# Patient Record
Sex: Female | Born: 1990 | Race: Black or African American | Hispanic: No | Marital: Single | Smoking: Never smoker
Health system: Southern US, Community
[De-identification: ages and names within clinical notes are randomized; demographics above are authoritative.]

## PROBLEM LIST (undated history)

## (undated) DIAGNOSIS — B009 Herpesviral infection, unspecified: Secondary | ICD-10-CM

## (undated) HISTORY — DX: Herpesviral infection, unspecified: B00.9

---
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
## 2011-10-22 ENCOUNTER — Emergency Department: Payer: Self-pay | Admitting: Emergency Medicine

## 2012-01-13 DIAGNOSIS — B009 Herpesviral infection, unspecified: Secondary | ICD-10-CM

## 2012-01-13 HISTORY — DX: Herpesviral infection, unspecified: B00.9

## 2012-03-01 ENCOUNTER — Emergency Department: Payer: Self-pay | Admitting: Emergency Medicine

## 2012-03-01 LAB — URINALYSIS, COMPLETE
Bacteria: NONE SEEN
Bilirubin,UR: NEGATIVE
Blood: NEGATIVE
Glucose,UR: NEGATIVE mg/dL (ref 0–75)
Ketone: NEGATIVE
Nitrite: NEGATIVE
Ph: 6 (ref 4.5–8.0)
Protein: NEGATIVE
RBC,UR: 1 /HPF (ref 0–5)
Specific Gravity: 1.024 (ref 1.003–1.030)
Squamous Epithelial: 1
WBC UR: 1 /HPF (ref 0–5)

## 2012-08-29 ENCOUNTER — Emergency Department: Payer: Self-pay | Admitting: Emergency Medicine

## 2012-10-01 ENCOUNTER — Emergency Department: Payer: Self-pay | Admitting: Emergency Medicine

## 2012-10-01 LAB — COMPREHENSIVE METABOLIC PANEL
Albumin: 3.5 g/dL (ref 3.4–5.0)
Alkaline Phosphatase: 117 U/L (ref 50–136)
Anion Gap: 7 (ref 7–16)
BUN: 18 mg/dL (ref 7–18)
Bilirubin,Total: 0.2 mg/dL (ref 0.2–1.0)
Calcium, Total: 9.7 mg/dL (ref 8.5–10.1)
Chloride: 108 mmol/L — ABNORMAL HIGH (ref 98–107)
Co2: 22 mmol/L (ref 21–32)
Creatinine: 0.68 mg/dL (ref 0.60–1.30)
EGFR (African American): >60
EGFR (Non-African Amer.): >60
Glucose: 99 mg/dL (ref 65–99)
Osmolality: 276 (ref 275–301)
Potassium: 4.3 mmol/L (ref 3.5–5.1)
SGOT(AST): 31 U/L (ref 15–37)
SGPT (ALT): 60 U/L (ref 12–78)
Sodium: 137 mmol/L (ref 136–145)
Total Protein: 7.8 g/dL (ref 6.4–8.2)

## 2012-10-01 LAB — DRUG SCREEN, URINE

## 2012-10-01 LAB — URINALYSIS, COMPLETE
Bacteria: NONE SEEN
Bilirubin,UR: NEGATIVE
Glucose,UR: NEGATIVE mg/dL (ref 0–75)
Ketone: NEGATIVE
Leukocyte Esterase: NEGATIVE
Nitrite: NEGATIVE
Ph: 7 (ref 4.5–8.0)
Protein: NEGATIVE
RBC,UR: 1 /HPF (ref 0–5)
Specific Gravity: 1.017 (ref 1.003–1.030)
Squamous Epithelial: 3
WBC UR: 1 /HPF (ref 0–5)

## 2012-10-01 LAB — PROTIME-INR
INR: 0.9
Prothrombin Time: 12.8 secs (ref 11.5–14.7)

## 2012-10-01 LAB — CBC
HCT: 41.2 % (ref 35.0–47.0)
HGB: 13.7 g/dL (ref 12.0–16.0)
MCH: 27.7 pg (ref 26.0–34.0)
MCHC: 33.1 g/dL (ref 32.0–36.0)
MCV: 83 fL (ref 80–100)
Platelet: 498 10*3/uL — ABNORMAL HIGH (ref 150–440)
RBC: 4.94 10*6/uL (ref 3.80–5.20)
RDW: 15.3 % — ABNORMAL HIGH (ref 11.5–14.5)
WBC: 12.2 10*3/uL — ABNORMAL HIGH (ref 3.6–11.0)

## 2012-10-01 LAB — ETHANOL
Ethanol %: <0.003 % (ref 0.000–0.080)
Ethanol: <3 mg/dL

## 2012-10-01 LAB — LIPASE, BLOOD: Lipase: 107 U/L (ref 73–393)

## 2013-03-25 ENCOUNTER — Emergency Department: Payer: Self-pay | Admitting: Emergency Medicine

## 2013-03-27 LAB — BETA STREP CULTURE(ARMC)

## 2013-05-27 ENCOUNTER — Emergency Department: Payer: Self-pay | Admitting: Emergency Medicine

## 2013-08-30 ENCOUNTER — Emergency Department: Payer: Self-pay | Admitting: Emergency Medicine

## 2013-09-01 ENCOUNTER — Inpatient Hospital Stay: Payer: Self-pay | Admitting: Internal Medicine

## 2013-09-01 LAB — CBC WITH DIFFERENTIAL/PLATELET
Basophil #: 0.1 10*3/uL (ref 0.0–0.1)
Basophil %: 0.5 %
Eosinophil #: 0.1 10*3/uL (ref 0.0–0.7)
Eosinophil %: 0.8 %
HCT: 40.9 % (ref 35.0–47.0)
HGB: 13.4 g/dL (ref 12.0–16.0)
Lymphocyte #: 1.6 10*3/uL (ref 1.0–3.6)
Lymphocyte %: 11.6 %
MCH: 28.2 pg (ref 26.0–34.0)
MCHC: 32.8 g/dL (ref 32.0–36.0)
MCV: 86 fL (ref 80–100)
Monocyte #: 1.3 x10 3/mm — ABNORMAL HIGH (ref 0.2–0.9)
Monocyte %: 9.1 %
Neutrophil #: 11 10*3/uL — ABNORMAL HIGH (ref 1.4–6.5)
Neutrophil %: 78 %
Platelet: 448 10*3/uL — ABNORMAL HIGH (ref 150–440)
RBC: 4.76 10*6/uL (ref 3.80–5.20)
RDW: 15.2 % — ABNORMAL HIGH (ref 11.5–14.5)
WBC: 14.1 10*3/uL — ABNORMAL HIGH (ref 3.6–11.0)

## 2013-09-01 LAB — COMPREHENSIVE METABOLIC PANEL
Albumin: 3.5 g/dL (ref 3.4–5.0)
Alkaline Phosphatase: 86 U/L
Anion Gap: 10 (ref 7–16)
BUN: 15 mg/dL (ref 7–18)
Bilirubin,Total: 0.3 mg/dL (ref 0.2–1.0)
Calcium, Total: 9.7 mg/dL (ref 8.5–10.1)
Chloride: 104 mmol/L (ref 98–107)
Co2: 26 mmol/L (ref 21–32)
Creatinine: 1.04 mg/dL (ref 0.60–1.30)
EGFR (African American): >60
EGFR (Non-African Amer.): >60
Glucose: 89 mg/dL (ref 65–99)
Osmolality: 280 (ref 275–301)
Potassium: 3.6 mmol/L (ref 3.5–5.1)
SGOT(AST): 18 U/L (ref 15–37)
SGPT (ALT): 29 U/L
Sodium: 140 mmol/L (ref 136–145)
Total Protein: 8.5 g/dL — ABNORMAL HIGH (ref 6.4–8.2)

## 2013-09-01 LAB — URINALYSIS, COMPLETE
Bilirubin,UR: NEGATIVE
Blood: NEGATIVE
Glucose,UR: NEGATIVE mg/dL (ref 0–75)
Leukocyte Esterase: NEGATIVE
Nitrite: NEGATIVE
Ph: 5 (ref 4.5–8.0)
Protein: NEGATIVE
RBC,UR: 13 /HPF (ref 0–5)
Specific Gravity: 1.023 (ref 1.003–1.030)
Squamous Epithelial: 5
WBC UR: 3 /HPF (ref 0–5)

## 2013-09-02 LAB — BASIC METABOLIC PANEL
Anion Gap: 9 (ref 7–16)
BUN: 14 mg/dL (ref 7–18)
Calcium, Total: 8.6 mg/dL (ref 8.5–10.1)
Chloride: 106 mmol/L (ref 98–107)
Co2: 24 mmol/L (ref 21–32)
Creatinine: 0.89 mg/dL (ref 0.60–1.30)
EGFR (African American): >60
EGFR (Non-African Amer.): >60
Glucose: 93 mg/dL (ref 65–99)
Osmolality: 278 (ref 275–301)
Potassium: 3.6 mmol/L (ref 3.5–5.1)
Sodium: 139 mmol/L (ref 136–145)

## 2013-09-02 LAB — CBC WITH DIFFERENTIAL/PLATELET
Basophil #: 0.1 10*3/uL (ref 0.0–0.1)
Basophil %: 0.4 %
Eosinophil #: 0.1 10*3/uL (ref 0.0–0.7)
Eosinophil %: 1 %
HCT: 36.1 % (ref 35.0–47.0)
HGB: 11.5 g/dL — ABNORMAL LOW (ref 12.0–16.0)
Lymphocyte #: 2.4 10*3/uL (ref 1.0–3.6)
Lymphocyte %: 18.9 %
MCH: 27.8 pg (ref 26.0–34.0)
MCHC: 32 g/dL (ref 32.0–36.0)
MCV: 87 fL (ref 80–100)
Monocyte #: 1.4 x10 3/mm — ABNORMAL HIGH (ref 0.2–0.9)
Monocyte %: 10.6 %
Neutrophil #: 8.9 10*3/uL — ABNORMAL HIGH (ref 1.4–6.5)
Neutrophil %: 69.1 %
Platelet: 408 10*3/uL (ref 150–440)
RBC: 4.16 10*6/uL (ref 3.80–5.20)
RDW: 14.6 % — ABNORMAL HIGH (ref 11.5–14.5)
WBC: 12.8 10*3/uL — ABNORMAL HIGH (ref 3.6–11.0)

## 2013-09-03 LAB — VANCOMYCIN, TROUGH: Vancomycin, Trough: 1 ug/mL — ABNORMAL LOW (ref 10–20)

## 2013-09-04 LAB — VANCOMYCIN, TROUGH: Vancomycin, Trough: 14 ug/mL (ref 10–20)

## 2013-09-06 LAB — CULTURE, BLOOD (SINGLE)

## 2013-09-07 LAB — WOUND CULTURE

## 2013-09-24 ENCOUNTER — Emergency Department: Payer: Self-pay | Admitting: Emergency Medicine

## 2013-11-08 ENCOUNTER — Ambulatory Visit: Payer: Self-pay | Admitting: Surgery

## 2013-12-13 ENCOUNTER — Inpatient Hospital Stay: Payer: Self-pay | Admitting: Surgery

## 2013-12-13 LAB — CBC WITH DIFFERENTIAL/PLATELET
Basophil #: 0.1 10*3/uL (ref 0.0–0.1)
Basophil %: 0.7 %
Eosinophil #: 0 10*3/uL (ref 0.0–0.7)
Eosinophil %: 0.5 %
HCT: 41.7 % (ref 35.0–47.0)
HGB: 13.5 g/dL (ref 12.0–16.0)
Lymphocyte #: 1.4 10*3/uL (ref 1.0–3.6)
Lymphocyte %: 16.8 %
MCH: 28.1 pg (ref 26.0–34.0)
MCHC: 32.3 g/dL (ref 32.0–36.0)
MCV: 87 fL (ref 80–100)
Monocyte #: 0.4 x10 3/mm (ref 0.2–0.9)
Monocyte %: 4.9 %
Neutrophil #: 6.6 10*3/uL — ABNORMAL HIGH (ref 1.4–6.5)
Neutrophil %: 77.1 %
Platelet: 410 10*3/uL (ref 150–440)
RBC: 4.8 10*6/uL (ref 3.80–5.20)
RDW: 15.2 % — ABNORMAL HIGH (ref 11.5–14.5)
WBC: 8.6 10*3/uL (ref 3.6–11.0)

## 2013-12-13 LAB — URINALYSIS, COMPLETE
Bacteria: NONE SEEN
Bilirubin,UR: NEGATIVE
Blood: NEGATIVE
Glucose,UR: NEGATIVE mg/dL (ref 0–75)
Ketone: NEGATIVE
Leukocyte Esterase: NEGATIVE
Nitrite: NEGATIVE
Ph: 7 (ref 4.5–8.0)
Protein: NEGATIVE
RBC,UR: NONE SEEN /HPF (ref 0–5)
Specific Gravity: 1.014 (ref 1.003–1.030)
Squamous Epithelial: 5
WBC UR: NONE SEEN /HPF (ref 0–5)

## 2013-12-13 LAB — COMPREHENSIVE METABOLIC PANEL
Albumin: 3.7 g/dL (ref 3.4–5.0)
Alkaline Phosphatase: 79 U/L
Anion Gap: 9 (ref 7–16)
BUN: 16 mg/dL (ref 7–18)
Bilirubin,Total: 0.3 mg/dL (ref 0.2–1.0)
Calcium, Total: 9.2 mg/dL (ref 8.5–10.1)
Chloride: 107 mmol/L (ref 98–107)
Co2: 24 mmol/L (ref 21–32)
Creatinine: 0.77 mg/dL (ref 0.60–1.30)
EGFR (African American): >60
EGFR (Non-African Amer.): >60
Glucose: 118 mg/dL — ABNORMAL HIGH (ref 65–99)
Osmolality: 282 (ref 275–301)
Potassium: 3.7 mmol/L (ref 3.5–5.1)
SGOT(AST): 29 U/L (ref 15–37)
SGPT (ALT): 40 U/L
Sodium: 140 mmol/L (ref 136–145)
Total Protein: 8.1 g/dL (ref 6.4–8.2)

## 2013-12-13 LAB — LIPASE, BLOOD: Lipase: 119 U/L (ref 73–393)

## 2013-12-13 LAB — PREGNANCY, URINE: Pregnancy Test, Urine: NEGATIVE m[IU]/mL

## 2014-05-05 NOTE — H&P (Signed)
PATIENT NAME:  Catherine [name], Catherine [name] MR#:  [id number] DATE OF BIRTH:  Apr 16, 1990  DATE OF ADMISSION:  09/01/2013  REFERRING PHYSICIAN: Dr. Cyril LoosenKinner.  PRIMARY CARE PHYSICIAN: None.   CHIEF COMPLAINT: Abscess of the buttocks.   HISTORY OF PRESENT ILLNESS: This 24 year old female without significant past medical history only history of pilonidal cyst with recurrent abscess and incision and drainage in the past, most recent before 2 months. The patient presents with fever and worsening of abscess and over the last 3 days, and tenderness in that area. Upon presentation, the patient was found to be febrile at 99.3 and tachycardic at 119. As well, she did have significant leukocytosis of 14,000. The patient had incision and drainage done by ED physician, the patient reports she was supposed to follow with Dr. Egbert GaribaldiBird  next week for pilonidal cyst. Given the patient's fever and leukocytosis hospitalist was requested for the patient for IV antibiotics after her blood cultures were sent. The patient denies any chest pain, shortness of breath, hemoptysis, diarrhea, constipation, dysuria or polyuria.   PAST MEDICAL HISTORY: None.   PAST SURGICAL HISTORY: C-section.   ALLERGIES: No known drug allergies.   HOME MEDICATIONS: None.   FAMILY HISTORY: There is family history of  diabetes mellitus and hypertension.   SOCIAL HISTORY: The patient smokes 5 cigarettes per cigarettes per day. Works as LawyerCNA. No alcohol. No illicit drug use.   REVIEW OF SYSTEMS:  CONSTITUTIONAL: Reports fever, chills. Denies any fatigue, weight gain, weight loss.  EYES: Denies blurry vision, double vision, inflammation.  EARS, NOSE, THROAT: Denies tinnitus, ear pain, hearing loss, epistaxis or discharge.  RESPIRATORY: Denies cough, wheezing, hemoptysis, COPD.  CARDIOVASCULAR: Denies chest pain, orthopnea, edema.  GASTROINTESTINAL: Denies nausea, vomiting, diarrhea, abdominal pain.  GENITOURINARY: Denies dysuria, hematuria, or renal  colic.  ENDOCRINE: Denies polyuria, polydipsia, heat or cold intolerance.  HEMATOLOGY: Denies anemia, easy bruising, bleeding diathesis.  INTEGUMENTARY: Denies any acne, rash, reports abscess in her buttocks area.  MUSCULOSKELETAL: Denies any swelling, gout, cramps.  NEUROLOGIC: Denies CVA, TIA, tremors, vertigo, ataxia.  PSYCHIATRIC: Denies anxiety, insomnia or depression.   PHYSICAL EXAMINATION:  VITAL SIGNS: Temperature 99.3, pulse 101, respiratory rate 16, blood pressure 130/68, saturating 95% on room air.  GENERAL: Well-nourished female who looks comfortable, in no apparent distress.  HEENT: Head atraumatic, normocephalic. Pupils equal, reactive to light. Pink conjunctivae. Anicteric sclerae. Moist oral mucosa.  NECK: Supple. No thyromegaly. No JVD. No carotid bruits.  CHEST: Good air entry bilaterally. No wheezing, rales, rhonchi.  CARDIOVASCULAR: S1, S2 heard. No rubs, murmur or gallops.  ABDOMEN: Soft, nontender, nondistended. Bowel sounds present.  EXTREMITIES: No edema. No clubbing. No cyanosis. Pedal pulses +2 bilaterally.  PSYCHIATRIC: Appropriate affect. Awake, alert x 3. Intact judgment and insight.  NEUROLOGIC: Cranial nerves grossly intact. Motor 5/5. No focal deficits.  MUSCULOSKELETAL: No joint effusion or erythema.  SKIN: Has a skin abscess in the buttocks area which is drained and currently the wound appears to be packed. No discharge or oozing from the site.  LYMPHATIC: No cervical or subclavicular lymphadenopathy.   PERTINENT LABORATORY DATA: Glucose 89, BUN 15, creatinine 1.04, sodium 140, potassium 3.6, chloride 104, white blood cells 14,000, hemoglobin 13.4, hematocrit 40.9, platelets 448,000. Urinalysis negative for leukocyte esterase and nitrite.   ASSESSMENT AND PLAN:  1. Sepsis. The patient presents with tachycardia, fever and leukocytosis. This is secondary to abscess in her pilonidal cyst status post incision and drainage by ED physician and wound packing. The  patient will be admitted  for broad-spectrum intravenous antibiotics. We will follow on her urine cultures and adjust as needed. We will consult surgical service, Dr. Egbert Garibaldi  as she was supposed to see him next week as an outpatient.  2. As well, we will continue her on p.r.n. pain medicine.  3. Deep vein thrombosis prophylaxis. Subcutaneous heparin.   CODE STATUS: Full code.   TOTAL TIME SPENT ON ADMISSION AND PATIENT CARE: 45 minutes.     ____________________________ Starleen Arms, MD dse:JT D: 09/02/2013 00:03:00 ET T: 09/02/2013 01:00:06 ET JOB#: [id number]  cc: Starleen Arms, MD, <Dictator> DAWOOD Teena Irani MD ELECTRONICALLY SIGNED 09/02/2013 4:14

## 2014-05-05 NOTE — Op Note (Signed)
PATIENT NAME:  Catherine [name], Catherine [name] MR#:  [id number] DATE OF BIRTH:  [birth year]  DATE OF PROCEDURE:  11/08/2013  PREOPERATIVE DIAGNOSIS: Recurrent pilonidal cyst infection and abscess.   POSTOPERATIVE DIAGNOSIS: Recurrent pilonidal cyst infection and abscess.   PROCEDURE PERFORMED: Pilonidal cystectomy.   SURGEON: Dr. Natale LayMark Abiola Behring. D, FACS  ANESTHESIA: General with 30 mL of 0.25% plain Marcaine as local.   FINDINGS: Pilonidal cyst.   SPECIMENS: Pilonidal cyst.   ESTIMATED BLOOD LOSS: 25 mL.   DESCRIPTION OF PROCEDURE: With informed consent, supine position on the stretcher, general endotracheal anesthesia was induced. The patient was then padded and positioned in prone jackknife positioning. The lower back and sacral region was sterilely prepped and draped with Betadine solution. Timeout was observed. An elliptical incision encompassing the previous tract of infection on the right side was fashioned with scalpel and carried down with electrocautery through subcutaneous fat down to the presacral fascia. Hemostasis being obtained, a specimen was submitted to pathology in formalin.   Total of 30 mL of 0.25% plain Marcaine was infiltrated on the operative field. The wound was then reapproximated easily utilizing a combination of deep 0 Vicryl sutures, deep dermal 3-0 Vicryl sutures, and a combination of 3-0 nylon simple and vertical mattress in the skin layer. A sterile dressing was applied. The patient tolerated the procedure well without immediate complication and was transported to the recovery room following return to the supine position and extubation by anesthesia services.    ____________________________ Redge GainerMark A. Egbert GaribaldiBird, MD FACS mab:lt D: 11/08/2013 10:34:24 ET T: 11/08/2013 17:32:40 ET JOB#: [id number]  cc: Loraine LericheMark A. Egbert GaribaldiBird, MD, <Dictator> Raynald KempMARK A Rashidi Loh MD ELECTRONICALLY SIGNED 11/12/2013 17:33

## 2014-05-05 NOTE — Op Note (Signed)
PATIENT NAME:  Catherine [name], Catherine [name] DATE OF BIRTH:  10-30-1990  DATE OF PROCEDURE:  12/14/2013  PREOPERATIVE DIAGNOSIS:  Acute calculus cholecystitis.     POSTOPERATIVE DIAGNOSIS:  Acute calculus cholecystitis with hydrops.   PROCEDURE PERFORMED: Laparoscopic cholecystectomy.   SURGEON: Nhyira Leano A. Egbert GaribaldiBird, MD.   ASSISTANT: None.   TYPE OF ANESTHESIA: General endotracheal.   FINDINGS: As above.   SPECIMENS: As above.   ESTIMATED BLOOD LOSS: Minimal.   DESCRIPTION OF PROCEDURE: With informed consent, supine position, and general oral endotracheal anesthesia, the patient's abdomen was widely prepped and draped with ChloraPrep solution. Timeout was observed. A 12 mm blunt Hasson trocar was placed under direct visualization and pneumoperitoneum was established. The patient was then positioned in reverse Trendelenburg and airplaned right side up. Remaining trocars were placed with a 5 mm bladeless trocar in the epigastric region, two 5 mm ports in the right subcostal margin. The gallbladder was aspirated of 20 mL of hydropic fluid allowing grasping of the fundus. The gallbladder was grasped along the fundus and elevated towards the right shoulder laterally. Lateral traction was achieved on Hartmann pouch. The hepatoduodenal ligament was then incised utilizing blunt technique and a critical view of safety cholecystectomy was achieved. The cystic duct was triply clipped on the portal side, singly clipped on the gallbladder side, and divided. Likewise the cystic artery was doubly clipped on the portal side, singly clipped on the gallbladder side, and divided. The gallbladder was then retrieved off the gallbladder fossa utilizing hook cautery apparatus and placed into an Endo Catch device and retrieved. With pneumoperitoneum re-established the right upper quadrant was irrigated with a total of 1 liter of normal saline and aspirated dry. Point hemostasis was obtained with cautery. Ports were then  removed. The infraumbilical fascial defect was closed with an additional figure-of-eight number 0 Vicryl suture and the existing stay sutures tied to each other.   30 mL of 0.25% plain Marcaine was infiltrated along all skin and fascial incisions prior to closure. 4-0 Vicryl subcuticular was applied to all skin edges followed by the application of benzoin, Steri-Strips, Telfa, and Tegaderm. The patient was then subsequently extubated and taken to the recovery room in stable and satisfactory condition by anesthesia services.     ____________________________ Redge GainerMark A. Egbert GaribaldiBird, MD mab:bu D: 12/19/2013 15:29:32 ET T: 12/19/2013 21:06:58 ET JOB#: [id number]  cc: Loraine LericheMark A. Egbert GaribaldiBird, MD, <Dictator> Mahi Zabriskie A Terran Hollenkamp MD ELECTRONICALLY SIGNED 12/24/2013 22:03

## 2014-05-05 NOTE — H&P (Signed)
PATIENT NAME:  Catherine [name], Catherine [name] MR#:  [id number] DATE OF BIRTH:  October 15, 1990  DATE OF ADMISSION:  12/13/2013  CHIEF COMPLAINT: Right upper quadrant pain.   HISTORY OF PRESENT ILLNESS: This is a patient with multiple episodes of recurrent episodic right upper quadrant pain that had been associated with fatty food intolerance that has been going on about a year, but this time it started yesterday evening after dinner and it has been unrelenting. It is causing her considerable right upper quadrant pain. She is also nauseated and has vomited multiple times. She had a normal bowel movement yesterday. No melena or hematochezia. No fevers or chills. No jaundice or acholic stools. She has no back pain.   PAST MEDICAL HISTORY: None.   PAST SURGICAL HISTORY: C-section, pilonidal cyst I and D, and pilonidal cystectomy recently by Dr. Natale LayMark Bird.   ALLERGIES: None.   MEDICATIONS: None.   FAMILY HISTORY: Noncontributory.   SOCIAL HISTORY: The patient is a CNA. She smokes about 1/2 pack of cigarettes per day. She does not drink much alcohol on a routine basis.   REVIEW OF SYSTEMS: A 10 system review is performed and negative, with the exception of that mentioned in the HPI.   PHYSICAL EXAMINATION:  GENERAL: Obese female patient with a BMI of 35; 200 pounds. In general, she appears uncomfortable, laying on her left side, but in no acute distress.  VITAL SIGNS: Temperature of 97.5, pulse 77, respirations 18, blood pressure 110/65, pain scale of 8, 100% room air saturation.  HEENT: No scleral icterus.  NECK: No palpable neck nodes.  CHEST: Clear to auscultation.  CARDIAC: Regular rate and rhythm.  ABDOMEN: Soft, but there is some localized guarding and positive Murphy sign in the right upper quadrant; C-section scar is noted.  EXTREMITIES: Without edema.  NEUROLOGIC: Grossly intact.  INTEGUMENT: A healing wound in the pilonidal area, without erythema or drainage.   DIAGNOSTIC DATA: Chem panel is  currently pending. White blood cell count is 8.6, H and H of 13.5 and 42, with a platelet count of 410,000. Again, electrolytes and LFTs are not as yet available.   An ultrasound shows a large stone impacted in the neck of the gallbladder with a positive ultrasonographic Murphy sign.   ASSESSMENT AND PLAN: Acute cholecystitis. Recommend admission and hydration, due to her nausea and vomiting. Control her nausea and vomiting with antiemetics. Start IV antibiotics and plan laparoscopic cholecystectomy at Dr. Molinda BailiffBird's convenience and discretion. I will discuss the patient with him. He has recently operated on her; therefore, I did not do the consent to allow him the opportunity to do that at his discretion.    ____________________________ Adah Salvageichard E. Excell Seltzerooper, MD rec:MT D: 12/13/2013 06:53:47 ET T: 12/13/2013 08:44:47 ET JOB#: [id number]  cc: Adah Salvageichard E. Excell Seltzerooper, MD, <Dictator> Lattie HawICHARD E Klara Stjames MD ELECTRONICALLY SIGNED 12/13/2013 23:30

## 2014-05-05 NOTE — H&P (Signed)
Subjective/Chief Complaint RUQ pain   History of Present Illness mult episodes with FFI, now unrelenting started yest eve nausea, emesis no f/c no jaundice   Past History PMH none PSH Csection, pilo cyst I&D and recent cystectomy   Past Med/Surgical Hx:  Obesity:   Pilonidal Cyst:   I & D Pilonidal Cyst:   C-Section:   ALLERGIES:  No Known Allergies:   Family and Social History:  Family History Non-Contributory   Social History positive  tobacco, negative ETOH, CNA   + Tobacco Current (within 1 year)   Place of Living Home   Review of Systems:  Fever/Chills No   Cough No   Abdominal Pain Yes   Diarrhea No   Constipation No   Nausea/Vomiting Yes   SOB/DOE No   Chest Pain No   Dysuria No   Tolerating Diet No  Nauseated  Vomiting   Physical Exam:  GEN no acute distress, obese, uncomfortable   HEENT pink conjunctivae   NECK supple   RESP normal resp effort  clear BS   CARD regular rate   ABD positive tenderness  pos Murphy's sign   LYMPH negative neck   EXTR negative edema   SKIN normal to palpation, pilo cyst scar healing   PSYCH alert, A+O to time, place, person, good insight   Lab Results: Hepatic:  02-Dec-15 04:42   Bilirubin, Total -  Alkaline Phosphatase - (46-116 NOTE: New Reference Range 08/01/13)  SGPT (ALT) - (14-63 NOTE: New Reference Range 08/01/13)  SGOT (AST) -  Total Protein, Serum -  Albumin, Serum -  Routine Chem:  02-Dec-15 04:42   Result Comment CBC/METC/LIPASE - QUESTIONABLE SPECIMEN INTEGRITY. ORDER  - CANCELLED AND REORDERED FOR A RECOLLECT.  - CALLED RAQUEL DAVID AT 0141 ON 12/13/13  - [place]  Result(s) reported on 13 Dec 2013 at 05:18AM.  Glucose, Serum -  BUN -  Creatinine (comp) -  Sodium, Serum -  Potassium, Serum -  Chloride, Serum -  CO2, Serum -  Calcium (Total), Serum -  Osmolality (calc) -  eGFR (African American) -  eGFR (Non-African American) - (eGFR values <25m/min/1.73 m2 may be an  indication of chronic kidney disease (CKD). Calculated eGFR, using the MRDR Study equation, is useful in  patients with stable renal function. The eGFR calculation will not be reliable in acutely ill patients when serum creatinine is changing rapidly. It is not useful in patients on dialysis. The eGFR calculation may not be applicable to patients at the low and high extremes of body sizes, pregnant women, and vegetarians.)  Anion Gap -  Lipase - (Result(s) reported on 13 Dec 2013 at 05:18AM.)  Routine Hem:  02-Dec-15 04:42   WBC (CBC) -  RBC (CBC) -  Hemoglobin (CBC) -  Hematocrit (CBC) -  Platelet Count (CBC) -  MCV -  MCH -  MCHC -  RDW -  Neutrophil % -  Lymphocyte % -  Monocyte % -  Eosinophil % -  Basophil % -  Neutrophil # -  Lymphocyte # -  Monocyte # -  Eosinophil # -  Basophil # -  Bands -  Segmented Neutrophils -  Lymphocytes -  Variant Lymphocytes -  Monocytes -  Eosinophil -  Basophil -  Metamyelocyte -  Myelocyte -  Promyelocyte -  Blast-Like -  Other Cells -  NRBC -  Diff Comment 1 -  Diff Comment 2 -  Diff Comment 3 -  Diff Comment 4 -  Diff  Comment 5 -  Diff Comment 6 -  Diff Comment 7 -  Diff Comment 8 -  Diff Comment 9 -  Diff Comment 10 - (Result(s) reported on 13 Dec 2013 at 05:18AM.)    06:18   WBC (CBC) 8.6  RBC (CBC) 4.80  Hemoglobin (CBC) 13.5  Hematocrit (CBC) 41.7  Platelet Count (CBC) 410  MCV 87  MCH 28.1  MCHC 32.3  RDW  15.2  Neutrophil % 77.1  Lymphocyte % 16.8  Monocyte % 4.9  Eosinophil % 0.5  Basophil % 0.7  Neutrophil #  6.6  Lymphocyte # 1.4  Monocyte # 0.4  Eosinophil # 0.0  Basophil # 0.1 (Result(s) reported on 13 Dec 2013 at 06:38AM.)   Radiology Results: [place]:    02-Dec-15 05:40, US Abdomen Limited Survey  US Abdomen Limited Survey  REASON FOR EXAM:    RUQ PAIN, VOMITING  COMMENTS:   Body Site: Gallbladder, Liver, Common Bile Duct    PROCEDURE: [place]  - US ABDOMEN LIMITED SURVEY  - Dec 13 2013   5:40AM     CLINICAL DATA:  Acute onset of right upper quadrant abdominal pain.  Initial encounter.    EXAM:  US ABDOMEN LIMITED - RIGHT UPPER QUADRANT    COMPARISON:  None.    FINDINGS:  Gallbladder:  A 1.7 cm stone is seen lodged at the neck of the gallbladder. The  gallbladder is mildly distended. No significant gallbladder wall  thickening or pericholecystic fluid is seen, but a positive  ultrasonographic Murphy's sign is elicited. This raises concern for  obstruction due to the stone at the neck of the gallbladder.    Common bile duct:    Diameter: 0.4 cm, within normal limits incaliber.    Liver:    No focal lesion identified. Within normal limits in parenchymal  echogenicity.   IMPRESSION:  1.7 cm stone lodged at the neck of the gallbladder, with mild  gallbladder distention. Positive ultrasonographic Murphy's sign  elicited. No evidence of cholecystitis; findings raise concern for  obstruction due to the stone at the neck of the gallbladder.      Electronically Signed    By: Garald Balding M.D.    On: 12/13/2013 06:08         Verified By: JEFFREY . CHANG, M.D.,    Assessment/Admission Diagnosis ac chole admit hydrate lap chole disc with Dr Marina Gravel   Electronic Signatures: Florene Glen (MD)  (Signed 02-Dec-15 06:50)  Authored: CHIEF COMPLAINT and HISTORY, PAST MEDICAL/SURGIAL HISTORY, ALLERGIES, FAMILY AND SOCIAL HISTORY, REVIEW OF SYSTEMS, PHYSICAL EXAM, LABS, Radiology, ASSESSMENT AND PLAN   Last Updated: 02-Dec-15 06:50 by Florene Glen (MD)

## 2014-05-05 NOTE — Discharge Summary (Signed)
PATIENT NAME:  Catherine [name], Catherine [name] MR#:  [id number] DATE OF BIRTH:  03/11/1990  DATE OF ADMISSION:  12/13/2013 DATE OF DISCHARGE:  12/14/2013  FINAL DIAGNOSIS: Acute calculus cholecystitis.   PRINCIPAL PROCEDURES: 1. Right upper quadrant ultrasound.  2. Laparoscopic cholecystectomy.   HOSPITAL COURSE SUMMARY: The patient was admitted with severe abdominal pain. We attempted to perform a cholecystectomy on December 2, but due to operating room availability, this was scheduled for the next day on December 3. Cholecystectomy was performed uneventfully with laparoscopic approach. The patient did well. Later that same day, she was discharged home in stable condition.   DISCHARGE MEDICATIONS: Depo-Provera, Tylenol, and Percocet as needed for pain.   FOLLOWUP INSTRUCTIONS: Call with any questions or concerns. Follow up in our office in 7 to 10 days.    ____________________________ Redge GainerMark A. Egbert GaribaldiBird, MD mab:JT D: 12/17/2013 11:15:42 ET T: 12/17/2013 11:53:38 ET JOB#: [id number]  cc: Loraine LericheMark A. Egbert GaribaldiBird, MD, <Dictator> Raynald KempMARK A Delano Scardino MD ELECTRONICALLY SIGNED 12/17/2013 18:49

## 2014-05-05 NOTE — Discharge Summary (Signed)
PATIENT NAME:  Catherine [name], [name] MR#:  [id number] DATE OF BIRTH:  14-Dec-1990  DATE OF ADMISSION:  09/01/2013 DATE OF DISCHARGE:  09/04/2013  ADMISSION DIAGNOSES:  Pilonidal cyst/abscess.   DISCHARGE DIAGNOSES: Pilonidal cyst/abscess status post incision, and drainage.   CONSULTATIONS: Surgery.   Wound cultures are positive for anaerobe bacteria.   HOSPITAL COURSE: A 24 year old female with history of pilonidal cyst who presented with increasing pain, and induration of her cyst as well as drainage. She is status I and D in the ER. For further details, please refer to the H and P.  1. Pilonidal cyst and abscess status post I and D in the ER, see my surgery during this hospitalization; as per surgery, no further intervention at this time. She will continue with home health care for wound care. Her cultures are positive for anaerobes, she will be discharged with Keflex, and clindamycin. She has a follow-up on Thursday with surgery, Dr. Egbert GaribaldiBird, MD.  2.  Sepsis due to above issue which has resolved.   DISCHARGE MEDICATIONS:   1.  Keflex 500 mg p.o. every 8 hours x 10 days.  2.   Clindamycin 300 mg every 8 hours x 10 days.  3.  Lactobacillus 2 tablets b.i.d. x 10 days.  4.  Docusate 100 mg b.i.d. p.r.n.  5.  Depo-Provera 1 IM every 3 months.  6.  Acetaminophen oxycodone 325/5  every 8 hours p.r.n. pain.   Discharge with home health for dressing changes.   DISCHARGE DIET: Regular diet.   DISCHARGE ACTIVITY: As tolerated.   DISCHARGE FOLLOWUP:  The patient will follow up with Dr. Egbert GaribaldiBird on Thursday, it is already schedule by the patient.   TIME SPENT: Approximately 35 minutes.   CONDITION: The patient is stable for discharge.    ____________________________ Delshawn Stech P. Juliene PinaMody, MD spm:nt D: 09/04/2013 11:50:57 ET T: 09/04/2013 21:49:54 ET JOB#: [id number]  cc: Amori Colomb P. Juliene PinaMody, MD, <Dictator> Mark A. Egbert GaribaldiBird, MD Janyth ContesSITAL P Shameria Trimarco MD ELECTRONICALLY SIGNED 09/05/2013 14:46

## 2014-05-07 LAB — SURGICAL PATHOLOGY

## 2015-01-09 ENCOUNTER — Emergency Department
Admission: EM | Admit: 2015-01-09 | Discharge: 2015-01-09 | Disposition: A | Discharge status: Home / Self Care | Payer: Managed Care, Other (non HMO) | Attending: Emergency Medicine | Admitting: Emergency Medicine

## 2015-01-09 ENCOUNTER — Encounter: Payer: Self-pay | Admitting: *Deleted

## 2015-01-09 DIAGNOSIS — J029 Acute pharyngitis, unspecified: Secondary | ICD-10-CM | POA: Insufficient documentation

## 2015-01-09 DIAGNOSIS — J0100 Acute maxillary sinusitis, unspecified: Secondary | ICD-10-CM | POA: Insufficient documentation

## 2015-01-09 DIAGNOSIS — R51 Headache: Secondary | ICD-10-CM | POA: Diagnosis present

## 2015-01-09 MED ORDER — IBUPROFEN 800 MG PO TABS: 800.0000 mg | ORAL_TABLET | Freq: Three times a day (TID) | ORAL | Status: DC | PRN

## 2015-01-09 MED ORDER — FEXOFENADINE-PSEUDOEPHED ER 60-120 MG PO TB12: 1.0000 | ORAL_TABLET | Freq: Two times a day (BID) | ORAL | Status: DC

## 2015-01-09 MED ORDER — AMOXICILLIN 500 MG PO CAPS: 500.0000 mg | ORAL_CAPSULE | Freq: Three times a day (TID) | ORAL | Status: DC

## 2015-01-09 MED ORDER — BENZONATATE 100 MG PO CAPS: 100.0000 mg | ORAL_CAPSULE | Freq: Three times a day (TID) | ORAL | Status: DC | PRN

## 2015-01-09 NOTE — Discharge Instructions (Signed)
Sinusitis, Adult  Sinusitis is redness, soreness, and puffiness (inflammation) of the air pockets in the bones of your face (sinuses). The redness, soreness, and puffiness can cause air and mucus to get trapped in your sinuses. This can allow germs to grow and cause an infection.   HOME CARE    Drink enough fluids to keep your pee (urine) clear or pale yellow.   Use a humidifier in your home.   Run a hot shower to create steam in the bathroom. Sit in the bathroom with the door closed. Breathe in the steam 3-4 times a day.   Put a warm, moist washcloth on your face 3-4 times a day, or as told by your doctor.   Use salt water sprays (saline sprays) to wet the thick fluid in your nose. This can help the sinuses drain.   Only take medicine as told by your doctor.  GET HELP RIGHT AWAY IF:    Your pain gets worse.   You have very bad headaches.   You are sick to your stomach (nauseous).   You throw up (vomit).   You are very sleepy (drowsy) all the time.   Your face is puffy (swollen).   Your vision changes.   You have a stiff neck.   You have trouble breathing.  MAKE SURE YOU:    Understand these instructions.   Will watch your condition.   Will get help right away if you are not doing well or get worse.     This information is not intended to replace advice given to you by your health care provider. Make sure you discuss any questions you have with your health care provider.     Document Released: 06/17/2007 Document Revised: 01/19/2014 Document Reviewed: 08/04/2011  Elsevier Interactive Patient Education 2016 [employer]

## 2015-01-09 NOTE — ED Notes (Signed)
States she developed cough,cold sx's and body aches about 3-4 days ago  Unsure of fever but has had hot and cold chills

## 2015-01-09 NOTE — ED Provider Notes (Signed)
[facility] Emergency Department Provider Note  ____________________________________________  Time seen: Approximately 10:18 AM  I have reviewed the triage vital signs and the nursing notes.   HISTORY  Chief Complaint URI    HPI Ronnette JuniperBrianna L Graddy is a 24 y.o. female patient complained of frontal headache, left ear pressure, cough, sore throat, and nasal congestion. Patient denies any fever with this complaint. She denies any nausea vomiting diarrhea. Onset was 4 days ago. Patient sent to ER from her job. Patient rates the pain as 8/10 describe as "achy". Patient has been using over-the-counter medicine for this complaint about any relief.   History reviewed. No pertinent past medical history.  There are no active problems to display for this patient.   History reviewed. No pertinent past surgical history.  Current Outpatient Rx  Name  Route  Sig  Dispense  Refill  . amoxicillin (AMOXIL) 500 MG capsule   Oral   Take 1 capsule (500 mg total) by mouth 3 (three) times daily.   30 capsule   0   . benzonatate (TESSALON PERLES) 100 MG capsule   Oral   Take 1 capsule (100 mg total) by mouth 3 (three) times daily as needed for cough.   15 capsule   0   . fexofenadine-pseudoephedrine (ALLEGRA-D) 60-120 MG 12 hr tablet   Oral   Take 1 tablet by mouth 2 (two) times daily.   30 tablet   0   . ibuprofen (ADVIL,MOTRIN) 800 MG tablet   Oral   Take 1 tablet (800 mg total) by mouth every 8 (eight) hours as needed.   30 tablet   0     Allergies Review of patient's allergies indicates no known allergies.  No family history on file.  Social History Social History  Substance Use Topics  . Smoking status: Never Smoker   . Smokeless tobacco: None  . Alcohol Use: No    Review of Systems Constitutional: No fever/chills Eyes: No visual changes. ENT: Sore throat. Edematous nasal turbinates. Postnasal drainage. Cardiovascular: Denies chest  pain. Respiratory: Denies shortness of breath. Gastrointestinal: No abdominal pain.  No nausea, no vomiting.  No diarrhea.  No constipation. Genitourinary: Negative for dysuria. Musculoskeletal: Negative for back pain. Skin: Negative for rash. Neurological: Negative for headaches, focal weakness or numbness. 10-point ROS otherwise negative.  ____________________________________________   PHYSICAL EXAM:  VITAL SIGNS: ED Triage Vitals  Enc Vitals Group     BP 01/09/15 1000 125/81 mmHg     Pulse Rate 01/09/15 1000 106     Resp 01/09/15 1000 18     Temp 01/09/15 1000 98.2 F (36.8 C)     Temp Source 01/09/15 1000 Oral     SpO2 01/09/15 1000 99 %     Weight 01/09/15 1000 225 lb (102.059 kg)     Height 01/09/15 1000 5\' 3"  (1.6 m)     Head Cir --      Peak Flow --      Pain Score 01/09/15 1004 8     Pain Loc --      Pain Edu? --      Excl. in GC? --     Constitutional: Alert and oriented. Well appearing and in no acute distress. Eyes: Conjunctivae are normal. PERRL. EOMI. Head: Atraumatic. Nose: No congestion/rhinnorhea. Bilateral maxillary guarding Mouth/Throat: Mucous membranes are moist.  Oropharynx non-erythematous. Neck: No stridor.  No cervical spine tenderness to palpation. Hematological/Lymphatic/Immunilogical: No cervical lymphadenopathy. Cardiovascular: Normal rate, regular rhythm. Grossly normal heart  sounds.  Good peripheral circulation. Respiratory: Normal respiratory effort.  No retractions. Lungs CTAB. Gastrointestinal: Soft and nontender. No distention. No abdominal bruits. No CVA tenderness. Musculoskeletal: No lower extremity tenderness nor edema.  No joint effusions. Neurologic:  Normal speech and language. No gross focal neurologic deficits are appreciated. No gait instability. Skin:  Skin is warm, dry and intact. No rash noted. Psychiatric: Mood and affect are normal. Speech and behavior are normal.  ____________________________________________    LABS (all labs ordered are listed, but only abnormal results are displayed)  Labs Reviewed - No data to display ____________________________________________  EKG   ____________________________________________  RADIOLOGY   ____________________________________________   PROCEDURES  Procedure(s) performed: None  Critical Care performed: No  ____________________________________________   INITIAL IMPRESSION / ASSESSMENT AND PLAN / ED COURSE  Pertinent labs & imaging results that were available during my care of the patient were reviewed by me and considered in my medical decision making (see chart for details).  Bilateral maxillary sinusitis. Patient given discharge care instructions. Given a work note for 2 days. She get a prescription for amoxicillin, Allegra-D, Tessalon, and ibuprofen. ____________________________________________   FINAL CLINICAL IMPRESSION(S) / ED DIAGNOSES  Final diagnoses:  Subacute maxillary sinusitis      Joni Reining, PA-C 01/09/15 1041  Minna Antis, MD 01/09/15 [phone]

## 2015-01-09 NOTE — ED Notes (Signed)
Pt reports cough, congestion, fever , body aches and chest discomfort for 4 days

## 2015-01-13 HISTORY — PX: CHOLECYSTECTOMY, LAPAROSCOPIC: SHX56

## 2017-10-15 ENCOUNTER — Encounter: Payer: Self-pay | Admitting: Obstetrics and Gynecology

## 2017-10-15 ENCOUNTER — Other Ambulatory Visit (HOSPITAL_COMMUNITY)
Admission: RE | Admit: 2017-10-15 | Discharge: 2017-10-15 | Disposition: A | Discharge status: Home / Self Care | Payer: Managed Care, Other (non HMO) | Source: Ambulatory Visit | Attending: Obstetrics and Gynecology | Admitting: Obstetrics and Gynecology

## 2017-10-15 ENCOUNTER — Ambulatory Visit (INDEPENDENT_AMBULATORY_CARE_PROVIDER_SITE_OTHER): Payer: Managed Care, Other (non HMO) | Admitting: Obstetrics and Gynecology

## 2017-10-15 VITALS — BP 122/78 | HR 94 | Ht 63.0 in | Wt 196.0 lb

## 2017-10-15 DIAGNOSIS — F4321 Adjustment disorder with depressed mood: Secondary | ICD-10-CM | POA: Diagnosis not present

## 2017-10-15 DIAGNOSIS — Z01411 Encounter for gynecological examination (general) (routine) with abnormal findings: Secondary | ICD-10-CM | POA: Diagnosis not present

## 2017-10-15 DIAGNOSIS — Z124 Encounter for screening for malignant neoplasm of cervix: Secondary | ICD-10-CM

## 2017-10-15 DIAGNOSIS — Z1239 Encounter for other screening for malignant neoplasm of breast: Secondary | ICD-10-CM

## 2017-10-15 DIAGNOSIS — F329 Major depressive disorder, single episode, unspecified: Secondary | ICD-10-CM

## 2017-10-15 DIAGNOSIS — F419 Anxiety disorder, unspecified: Secondary | ICD-10-CM | POA: Diagnosis not present

## 2017-10-15 DIAGNOSIS — F32A Depression, unspecified: Secondary | ICD-10-CM

## 2017-10-15 DIAGNOSIS — F4329 Adjustment disorder with other symptoms: Secondary | ICD-10-CM

## 2017-10-15 DIAGNOSIS — F4381 Prolonged grief disorder: Secondary | ICD-10-CM

## 2017-10-15 DIAGNOSIS — Z01419 Encounter for gynecological examination (general) (routine) without abnormal findings: Secondary | ICD-10-CM

## 2017-10-15 MED ORDER — ETONOGESTREL-ETHINYL ESTRADIOL 0.12-0.015 MG/24HR VA RING: VAGINAL_RING | VAGINAL | 11 refills | Status: DC

## 2017-10-15 NOTE — Patient Instructions (Signed)
Preventive Care 18-39 Years, Female Preventive care refers to lifestyle choices and visits with your health care provider that can promote health and wellness. What does preventive care include?  A yearly physical exam. This is also called an annual well check.  Dental exams once or twice a year.  Routine eye exams. Ask your health care provider how often you should have your eyes checked.  Personal lifestyle choices, including: ? Daily care of your teeth and gums. ? Regular physical activity. ? Eating a healthy diet. ? Avoiding tobacco and drug use. ? Limiting alcohol use. ? Practicing safe sex. ? Taking vitamin and mineral supplements as recommended by your health care provider. What happens during an annual well check? The services and screenings done by your health care provider during your annual well check will depend on your age, overall health, lifestyle risk factors, and family history of disease. Counseling Your health care provider may ask you questions about your:  Alcohol use.  Tobacco use.  Drug use.  Emotional well-being.  Home and relationship well-being.  Sexual activity.  Eating habits.  Work and work Statistician.  Method of birth control.  Menstrual cycle.  Pregnancy history.  Screening You may have the following tests or measurements:  Height, weight, and BMI.  Diabetes screening. This is done by checking your blood sugar (glucose) after you have not eaten for a while (fasting).  Blood pressure.  Lipid and cholesterol levels. These may be checked every 5 years starting at age 66.  Skin check.  Hepatitis C blood test.  Hepatitis B blood test.  Sexually transmitted disease (STD) testing.  BRCA-related cancer screening. This may be done if you have a family history of breast, ovarian, tubal, or peritoneal cancers.  Pelvic exam and Pap test. This may be done every 3 years starting at age 40. Starting at age 59, this may be done every 5  years if you have a Pap test in combination with an HPV test.  Discuss your test results, treatment options, and if necessary, the need for more tests with your health care provider. Vaccines Your health care provider may recommend certain vaccines, such as:  Influenza vaccine. This is recommended every year.  Tetanus, diphtheria, and acellular pertussis (Tdap, Td) vaccine. You may need a Td booster every 10 years.  Varicella vaccine. You may need this if you have not been vaccinated.  HPV vaccine. If you are 69 or younger, you may need three doses over 6 months.  Measles, mumps, and rubella (MMR) vaccine. You may need at least one dose of MMR. You may also need a second dose.  Pneumococcal 13-valent conjugate (PCV13) vaccine. You may need this if you have certain conditions and were not previously vaccinated.  Pneumococcal polysaccharide (PPSV23) vaccine. You may need one or two doses if you smoke cigarettes or if you have certain conditions.  Meningococcal vaccine. One dose is recommended if you are age 27-21 years and a first-year college student living in a residence hall, or if you have one of several medical conditions. You may also need additional booster doses.  Hepatitis A vaccine. You may need this if you have certain conditions or if you travel or work in places where you may be exposed to hepatitis A.  Hepatitis B vaccine. You may need this if you have certain conditions or if you travel or work in places where you may be exposed to hepatitis B.  Haemophilus influenzae type b (Hib) vaccine. You may need this if  you have certain risk factors.  Talk to your health care provider about which screenings and vaccines you need and how often you need them. This information is not intended to replace advice given to you by your health care provider. Make sure you discuss any questions you have with your health care provider. Document Released: 02/24/2001 Document Revised: 09/18/2015  Document Reviewed: 10/30/2014 Elsevier Interactive Patient Education  [employer].

## 2017-10-15 NOTE — Progress Notes (Signed)
Gynecology Annual Exam  PCP: Patient, No Pcp Per  Chief Complaint:  Chief Complaint  Patient presents with  . Gynecologic Exam    Wants to switch bc to nuvaring, having anxiety, dad passed recently.     History of Present Illness: Patient is a 27 y.o. No obstetric history on file. presents for annual exam. The patient has no complaints today.   LMP: Patient's last menstrual period was 09/23/2017 (exact date). Average Interval: regular, 28 days Duration of flow: 5 days Heavy Menses: no Clots: no Intermenstrual Bleeding: no Postcoital Bleeding: no Dysmenorrhea: yes  The patient is sexually active. She currently uses none for contraception. She denies dyspareunia.  The patient does perform self breast exams.  There is no notable family history of breast or ovarian cancer in her family.  The patient wears seatbelts: no.  The patient has regular exercise: no.    The patient repots current symptoms of depression.    The patient is a 27 y.o. female presenting initial evaluation for symptoms of anxiety and depression.  The patient is currently taking nothing for the management of her symptoms.  She has had any recent situational stressors, recent death of her father.  Her father's passing was unexpected.  Her primary support is her mother but she feels like she has to be there for her mother and little brother who are dealing with the same loss.  Her father would often times also take care of her son when she was at work.  She reports symptoms of anhedonia, insomnia, irritability, social anxiety, agorophobia, feelings of guilt, feelings of worthlessness and suicidal ideation.  She denies homicidal ideation, auditory hallucinations and visual hallucinations  The patient does not have a pre-existing history of depression and anxiety.  She  does not a prior history of suicide attempts. Suicidal ideations but not specific plan.   Review of Systems: Review of Systems  Constitutional:  Positive for malaise/fatigue. Negative for chills and fever.  HENT: Negative for congestion.   Respiratory: Negative for cough and shortness of breath.   Cardiovascular: Negative for chest pain and palpitations.  Gastrointestinal: Negative for abdominal pain, constipation, diarrhea, heartburn, nausea and vomiting.  Genitourinary: Negative for dysuria, frequency and urgency.  Skin: Negative for itching and rash.  Neurological: Negative for dizziness and headaches.  Endo/Heme/Allergies: Negative for polydipsia.  Psychiatric/Behavioral: Positive for depression and suicidal ideas. Negative for hallucinations, memory loss and substance abuse. The patient is nervous/anxious and has insomnia.     Past Medical History:  Past Medical History:  Diagnosis Date  . HSV-2 infection 2014    Past Surgical History:  Past Surgical History:  Procedure Laterality Date  . CHOLECYSTECTOMY, LAPAROSCOPIC  2017    Gynecologic History:  Patient's last menstrual period was 09/23/2017 (exact date). Contraception: none Last Pap: Results were: 05/06/2015 no abnormalities   Obstetric History: No obstetric history on file.  Family History:  Family History  Problem Relation Age of Onset  . Heart attack Father   . Heart attack Paternal Grandmother   . Heart attack Paternal Grandfather     Social History:  Social History   Socioeconomic History  . Marital status: Single    Spouse name: Not on file  . Number of children: Not on file  . Years of education: Not on file  . Highest education level: Not on file  Occupational History  . Not on file  Social Needs  . Financial resource strain: Not on file  . Food insecurity:  Worry: Not on file    Inability: Not on file  . Transportation needs:    Medical: Not on file    Non-medical: Not on file  Tobacco Use  . Smoking status: Never Smoker  . Smokeless tobacco: Never Used  Substance and Sexual Activity  . Alcohol use: Yes    Comment:  occasionally   . Drug use: Never  . Sexual activity: Yes    Birth control/protection: None  Lifestyle  . Physical activity:    Days per week: Not on file    Minutes per session: Not on file  . Stress: Not on file  Relationships  . Social connections:    Talks on phone: Not on file    Gets together: Not on file    Attends religious service: Not on file    Active member of club or organization: Not on file    Attends meetings of clubs or organizations: Not on file    Relationship status: Not on file  . Intimate partner violence:    Fear of current or ex partner: Not on file    Emotionally abused: Not on file    Physically abused: Not on file    Forced sexual activity: Not on file  Other Topics Concern  . Not on file  Social History Narrative  . Not on file    Allergies:  No Known Allergies  Medications: Prior to Admission medications   Medication Sig Start Date End Date Taking? Authorizing Provider  amoxicillin (AMOXIL) 500 MG capsule Take 1 capsule (500 mg total) by mouth 3 (three) times daily. 01/09/15   Joni Reining, PA-C  benzonatate (TESSALON PERLES) 100 MG capsule Take 1 capsule (100 mg total) by mouth 3 (three) times daily as needed for cough. 01/09/15   Joni Reining, PA-C  fexofenadine-pseudoephedrine (ALLEGRA-D) 60-120 MG 12 hr tablet Take 1 tablet by mouth 2 (two) times daily. 01/09/15   Joni Reining, PA-C  ibuprofen (ADVIL,MOTRIN) 800 MG tablet Take 1 tablet (800 mg total) by mouth every 8 (eight) hours as needed. 01/09/15   Joni Reining, PA-C    Physical Exam Vitals: There were no vitals taken for this visit.  General: NAD HEENT: normocephalic, anicteric Thyroid: no enlargement, no palpable nodules Pulmonary: No increased work of breathing, CTAB Cardiovascular: RRR, distal pulses 2+ Breast: Breast symmetrical, no tenderness, no palpable nodules or masses, no skin or nipple retraction present, no nipple discharge.  No axillary or supraclavicular  lymphadenopathy. Abdomen: NABS, soft, non-tender, non-distended.  Umbilicus without lesions.  No hepatomegaly, splenomegaly or masses palpable. No evidence of hernia  Genitourinary:  External: Normal external female genitalia.  Normal urethral meatus, normal Bartholin's and Skene's glands.    Vagina: Normal vaginal mucosa, no evidence of prolapse.    Cervix: Grossly normal in appearance, no bleeding  Uterus: Non-enlarged, mobile, normal contour.  No CMT  Adnexa: ovaries non-enlarged, no adnexal masses  Rectal: deferred  Lymphatic: no evidence of inguinal lymphadenopathy Extremities: no edema, erythema, or tenderness Neurologic: Grossly intact Psychiatric: mood appropriate, affect full  Female chaperone present for pelvic and breast  portions of the physical exam  Depression screen PHQ 2/9 10/15/2017  Decreased Interest 2  Down, Depressed, Hopeless 3  PHQ - 2 Score 5  Altered sleeping 3  Tired, decreased energy 3  Change in appetite 3  Feeling bad or failure about yourself  3  Trouble concentrating 3  Moving slowly or fidgety/restless 3  Suicidal thoughts 2  PHQ-9 Score 25  Difficult doing work/chores Very difficult    GAD 7 : Generalized Anxiety Score 10/15/2017  Nervous, Anxious, on Edge 3  Control/stop worrying 3  Worry too much - different things 3  Trouble relaxing 3  Restless 3  Easily annoyed or irritable 3  Afraid - awful might happen 3  Total GAD 7 Score 21  Anxiety Difficulty Very difficult      Assessment: 27 y.o. No obstetric history on file. routine annual exam  Plan: Problem List Items Addressed This Visit    None    Visit Diagnoses    Breast screening    -  Primary   Encounter for gynecological examination without abnormal finding       Screening for malignant neoplasm of cervix       Relevant Orders   Cytology - PAP   Anxiety and depression       Relevant Orders   CBC   TSH   B12   Grief reaction with prolonged bereavement       Relevant  Orders   CBC   TSH   B12      1) 4) Gardasil Series discussed and if applicable offered to patient - Patient has not previously completed 3 shot series  - given pamphlet  2) STI screening  wasoffered and declined  3)  ASCCP guidelines and rational discussed.  Patient opts for every 3 years screening interval  4) Contraception - the patient is currently using  none.  She is interested in changing to nuvaring We discussed safe sex practices to reduce her furture risk of STI's.    5) Depression anxiety - no history of suidcide attempts or prior depression.  Patient without specific plan and able to contract for safety.  We discussed if patient should have acute worsening of symptoms to re-present of go to ER.   - Lexapro 10mg , vistaril prn. - TSH, vitamin b12, and cbc - Given handout of local counselors.  I did discuss grief counseling service at [facility], [place] but given that the patient works at [employer] she may feel more comfortable seeing and independent counselor   6)  Return in about 2 weeks (around 10/29/2017) for medication follow up.   Vena Austria, MD, [facility], [facility] 10/15/2017, 7:43 PM

## 2017-10-16 LAB — CBC
Hematocrit: 39.9 % (ref 34.0–46.6)
Hemoglobin: 13.3 g/dL (ref 11.1–15.9)
MCH: 29.6 pg (ref 26.6–33.0)
MCHC: 33.3 g/dL (ref 31.5–35.7)
MCV: 89 fL (ref 79–97)
Platelets: 513 10*3/uL — ABNORMAL HIGH (ref 150–450)
RBC: 4.5 x10E6/uL (ref 3.77–5.28)
RDW: 13.1 % (ref 12.3–15.4)
WBC: 8.2 10*3/uL (ref 3.4–10.8)

## 2017-10-16 LAB — VITAMIN B12: Vitamin B-12: 417 pg/mL (ref 232–1245)

## 2017-10-16 LAB — TSH: TSH: 0.513 u[IU]/mL (ref 0.450–4.500)

## 2017-10-18 ENCOUNTER — Other Ambulatory Visit: Payer: Self-pay | Admitting: Obstetrics and Gynecology

## 2017-10-18 LAB — CYTOLOGY - PAP: Diagnosis: NEGATIVE

## 2017-10-18 MED ORDER — ESCITALOPRAM OXALATE 10 MG PO TABS: 10.0000 mg | ORAL_TABLET | Freq: Every day | ORAL | 2 refills | Status: DC

## 2017-11-01 ENCOUNTER — Encounter: Payer: Self-pay | Admitting: Obstetrics and Gynecology

## 2017-11-01 ENCOUNTER — Ambulatory Visit (INDEPENDENT_AMBULATORY_CARE_PROVIDER_SITE_OTHER): Payer: Managed Care, Other (non HMO) | Admitting: Obstetrics and Gynecology

## 2017-11-01 VITALS — BP 120/80 | HR 83 | Ht 63.0 in | Wt 198.0 lb

## 2017-11-01 DIAGNOSIS — F419 Anxiety disorder, unspecified: Principal | ICD-10-CM

## 2017-11-01 DIAGNOSIS — F418 Other specified anxiety disorders: Secondary | ICD-10-CM

## 2017-11-01 DIAGNOSIS — F329 Major depressive disorder, single episode, unspecified: Secondary | ICD-10-CM

## 2017-11-01 DIAGNOSIS — F4329 Adjustment disorder with other symptoms: Secondary | ICD-10-CM

## 2017-11-01 DIAGNOSIS — F4321 Adjustment disorder with depressed mood: Secondary | ICD-10-CM | POA: Diagnosis not present

## 2017-11-01 DIAGNOSIS — F4381 Prolonged grief disorder: Secondary | ICD-10-CM

## 2017-11-01 DIAGNOSIS — F32A Depression, unspecified: Secondary | ICD-10-CM

## 2017-11-01 MED ORDER — BUPROPION HCL ER (XL) 150 MG PO TB24: 150.0000 mg | ORAL_TABLET | Freq: Every day | ORAL | 2 refills | Status: DC

## 2017-11-01 NOTE — Progress Notes (Signed)
Obstetrics & Gynecology Office Visit   Chief Complaint:  Chief Complaint  Patient presents with  . Follow-up    Medication     History of Present Illness: The patient is a 27 y.o. female presenting follow up for symptoms of anxiety and depression.  The patient is currently taking lexapro 10mg  for the management of her symptoms.  She has not had any recent situational stressors.  She reports symptoms of anhedonia, day time somnolence, insomnia, irritability, social anxiety, agorophobia, feelings of guilt and feelings of worthlessness.  She denies risk taking behavior, feelings of worthlessness, suicidal ideation, homicidal ideation, auditory hallucinations and visual hallucinations. Symptoms have improved slightly since last visit.     However, she has continued to experience fairly significant nausea on lexapro which has not started to subside.  Review of Systems: Review of Systems  Constitutional: Negative.   Gastrointestinal: Positive for nausea and vomiting. Negative for abdominal pain.  Neurological: Negative for headaches.  Psychiatric/Behavioral: Positive for depression. Negative for hallucinations, memory loss, substance abuse and suicidal ideas. The patient is nervous/anxious and has insomnia.      Past Medical History:  Past Medical History:  Diagnosis Date  . HSV-2 infection 2014    Past Surgical History:  Past Surgical History:  Procedure Laterality Date  . CHOLECYSTECTOMY, LAPAROSCOPIC  2017    Gynecologic History: No LMP recorded.  Obstetric History: [id number]  Family History:  Family History  Problem Relation Age of Onset  . Heart attack Father   . Heart attack Paternal Grandmother   . Heart attack Paternal Grandfather     Social History:  Social History   Socioeconomic History  . Marital status: Single    Spouse name: Not on file  . Number of children: Not on file  . Years of education: Not on file  . Highest education level: Not on file    Occupational History  . Not on file  Social Needs  . Financial resource strain: Not on file  . Food insecurity:    Worry: Not on file    Inability: Not on file  . Transportation needs:    Medical: Not on file    Non-medical: Not on file  Tobacco Use  . Smoking status: Never Smoker  . Smokeless tobacco: Never Used  Substance and Sexual Activity  . Alcohol use: Yes    Comment: occasionally   . Drug use: Never  . Sexual activity: Yes    Birth control/protection: None  Lifestyle  . Physical activity:    Days per week: Not on file    Minutes per session: Not on file  . Stress: Not on file  Relationships  . Social connections:    Talks on phone: Not on file    Gets together: Not on file    Attends religious service: Not on file    Active member of club or organization: Not on file    Attends meetings of clubs or organizations: Not on file    Relationship status: Not on file  . Intimate partner violence:    Fear of current or ex partner: Not on file    Emotionally abused: Not on file    Physically abused: Not on file    Forced sexual activity: Not on file  Other Topics Concern  . Not on file  Social History Narrative  . Not on file    Allergies:  No Known Allergies  Medications: Prior to Admission medications   Medication Sig Start  Date End Date Taking? Authorizing Provider  escitalopram (LEXAPRO) 10 MG tablet Take 1 tablet (10 mg total) by mouth daily. 10/18/17 10/18/18 Yes Vena Austria, MD  etonogestrel-ethinyl estradiol (NUVARING) 0.12-0.015 MG/24HR vaginal ring Insert vaginally and leave in place for 3 consecutive weeks, then remove for 1 week. 10/15/17  Yes Vena Austria, MD  benzonatate (TESSALON PERLES) 100 MG capsule Take 1 capsule (100 mg total) by mouth 3 (three) times daily as needed for cough. Patient not taking: Reported on 10/15/2017 01/09/15   Joni Reining, PA-C  buPROPion (WELLBUTRIN XL) 150 MG 24 hr tablet Take 1 tablet (150 mg total) by mouth  daily. 11/01/17   Vena Austria, MD  fexofenadine-pseudoephedrine (ALLEGRA-D) 60-120 MG 12 hr tablet Take 1 tablet by mouth 2 (two) times daily. Patient not taking: Reported on 10/15/2017 01/09/15   Joni Reining, PA-C  ibuprofen (ADVIL,MOTRIN) 800 MG tablet Take 1 tablet (800 mg total) by mouth every 8 (eight) hours as needed. Patient not taking: Reported on 10/15/2017 01/09/15   Joni Reining, PA-C    Physical Exam Vitals:  Vitals:   11/01/17 1510  BP: 120/80  Pulse: 83   No LMP recorded.  General: NAD HEENT: normocephalic, anicteric Pulmonary: No increased work of breathing Neurologic: Grossly intact Psychiatric: mood appropriate, affect full    GAD 7 : Generalized Anxiety Score 11/01/2017 10/15/2017  Nervous, Anxious, on Edge 2 3  Control/stop worrying 2 3  Worry too much - different things 2 3  Trouble relaxing 2 3  Restless 2 3  Easily annoyed or irritable 3 3  Afraid - awful might happen 3 3  Total GAD 7 Score 16 21  Anxiety Difficulty Very difficult Very difficult    Depression screen [facility] 2/9 11/01/2017 10/15/2017  Decreased Interest 2 2  Down, Depressed, Hopeless 3 3  PHQ - 2 Score 5 5  Altered sleeping 3 3  Tired, decreased energy 3 3  Change in appetite 3 3  Feeling bad or failure about yourself  3 3  Trouble concentrating 3 3  Moving slowly or fidgety/restless 3 3  Suicidal thoughts 2 2  PHQ-9 Score 25 25  Difficult doing work/chores - Very difficult    Depression screen [facility] 2/9 11/01/2017 10/15/2017  Decreased Interest 2 2  Down, Depressed, Hopeless 3 3  PHQ - 2 Score 5 5  Altered sleeping 3 3  Tired, decreased energy 3 3  Change in appetite 3 3  Feeling bad or failure about yourself  3 3  Trouble concentrating 3 3  Moving slowly or fidgety/restless 3 3  Suicidal thoughts 2 2  PHQ-9 Score 25 25  Difficult doing work/chores - Very difficult     Assessment: 27 y.o. [id number] with anxiety and depression, onset following father's  passing  Plan: Problem List Items Addressed This Visit    None    Visit Diagnoses    Anxiety and depression    -  Primary   Relevant Medications   buPROPion (WELLBUTRIN XL) 150 MG 24 hr tablet   Grief reaction with prolonged bereavement          1) Anxiety and depression - some improvement on Lexapro.  GAD-7 is 16 PHQ-9 is 26 with item 9 answered as 2.  However, nausea on lexapro so rather than increasing dose will switch to wellbutrin   2) A total of 15 minutes were spent in face-to-face contact with the patient during this encounter with over half of that time devoted to counseling and  coordination of care.  3) Return in about 2 weeks (around 11/15/2017) for medication follow up.     Vena Austria, MD, [facility], [facility]

## 2017-11-15 ENCOUNTER — Ambulatory Visit (INDEPENDENT_AMBULATORY_CARE_PROVIDER_SITE_OTHER): Payer: Managed Care, Other (non HMO) | Admitting: Obstetrics and Gynecology

## 2017-11-15 ENCOUNTER — Encounter: Payer: Self-pay | Admitting: Obstetrics and Gynecology

## 2017-11-15 VITALS — BP 106/62 | HR 80 | Ht 63.0 in | Wt 200.0 lb

## 2017-11-15 DIAGNOSIS — F4321 Adjustment disorder with depressed mood: Secondary | ICD-10-CM | POA: Diagnosis not present

## 2017-11-15 DIAGNOSIS — F4329 Adjustment disorder with other symptoms: Secondary | ICD-10-CM

## 2017-11-15 DIAGNOSIS — F4381 Prolonged grief disorder: Secondary | ICD-10-CM

## 2017-11-15 NOTE — Progress Notes (Signed)
Obstetrics & Gynecology Office Visit   Chief Complaint:  Chief Complaint  Patient presents with  . medication follow up    History of Present Illness: The patient is a 27 y.o. female presenting follow up for symptoms of anxiety and depression.  The patient is currently taking Welbutrin 150mg   for the management of her symptoms.  She has not had any recent situational stressors.  She reports symptoms of irritability and social anxiety.  She denies risk taking behavior, agorophobia, feelings of guilt, feelings of worthlessness, suicidal ideation, homicidal ideation, auditory hallucinations and visual hallucinations. Symptoms have improved since last visit.     No side-effects on Wellbutrin, with good response in symptoms.  Review of Systems: Review of Systems  Constitutional: Negative.   Gastrointestinal: Negative for nausea.  Neurological: Negative for headaches.  Psychiatric/Behavioral: Negative.      Past Medical History:  Past Medical History:  Diagnosis Date  . HSV-2 infection 2014    Past Surgical History:  Past Surgical History:  Procedure Laterality Date  . CHOLECYSTECTOMY, LAPAROSCOPIC  2017    Gynecologic History: Patient's last menstrual period was 11/10/2017 (exact date).  Obstetric History: [id number]  Family History:  Family History  Problem Relation Age of Onset  . Heart attack Father   . Heart attack Paternal Grandmother   . Heart attack Paternal Grandfather     Social History:  Social History   Socioeconomic History  . Marital status: Single    Spouse name: Not on file  . Number of children: Not on file  . Years of education: Not on file  . Highest education level: Not on file  Occupational History  . Not on file  Social Needs  . Financial resource strain: Not on file  . Food insecurity:    Worry: Not on file    Inability: Not on file  . Transportation needs:    Medical: Not on file    Non-medical: Not on file  Tobacco Use  . Smoking  status: Never Smoker  . Smokeless tobacco: Never Used  Substance and Sexual Activity  . Alcohol use: Yes    Comment: occasionally   . Drug use: Never  . Sexual activity: Yes    Birth control/protection: None  Lifestyle  . Physical activity:    Days per week: Not on file    Minutes per session: Not on file  . Stress: Not on file  Relationships  . Social connections:    Talks on phone: Not on file    Gets together: Not on file    Attends religious service: Not on file    Active member of club or organization: Not on file    Attends meetings of clubs or organizations: Not on file    Relationship status: Not on file  . Intimate partner violence:    Fear of current or ex partner: Not on file    Emotionally abused: Not on file    Physically abused: Not on file    Forced sexual activity: Not on file  Other Topics Concern  . Not on file  Social History Narrative  . Not on file    Allergies:  No Known Allergies  Medications: Prior to Admission medications   Medication Sig Start Date End Date Taking? Authorizing Provider  buPROPion (WELLBUTRIN XL) 150 MG 24 hr tablet Take 1 tablet (150 mg total) by mouth daily. 11/01/17  Yes Vena Austria, MD  escitalopram (LEXAPRO) 10 MG tablet Take 1 tablet (10 mg  total) by mouth daily. 10/18/17 10/18/18 Yes Vena Austria, MD  etonogestrel-ethinyl estradiol (NUVARING) 0.12-0.015 MG/24HR vaginal ring Insert vaginally and leave in place for 3 consecutive weeks, then remove for 1 week. 10/15/17  Yes Vena Austria, MD  fexofenadine-pseudoephedrine (ALLEGRA-D) 60-120 MG 12 hr tablet Take 1 tablet by mouth 2 (two) times daily. Patient not taking: Reported on 10/15/2017 01/09/15   Joni Reining, PA-C  ibuprofen (ADVIL,MOTRIN) 800 MG tablet Take 1 tablet (800 mg total) by mouth every 8 (eight) hours as needed. Patient not taking: Reported on 10/15/2017 01/09/15   Joni Reining, PA-C    Physical Exam Vitals:  Vitals:   11/15/17 1351  BP:  106/62  Pulse: 80   Patient's last menstrual period was 11/10/2017 (exact date).  General: NAD HEENT: normocephalic, anicteric Pulmonary: No increased work of breathing Neurologic: Grossly intact Psychiatric: mood appropriate, affect full    GAD 7 : Generalized Anxiety Score 11/15/2017 11/01/2017 10/15/2017  Nervous, Anxious, on Edge 1 2 3   Control/stop worrying 1 2 3   Worry too much - different things 1 2 3   Trouble relaxing 1 2 3   Restless 1 2 3   Easily annoyed or irritable 2 3 3   Afraid - awful might happen 2 3 3   Total GAD 7 Score 9 16 21   Anxiety Difficulty Somewhat difficult Very difficult Very difficult    Depression screen [facility] 2/9 11/15/2017 11/01/2017 10/15/2017  Decreased Interest 0 2 2  Down, Depressed, Hopeless 0 3 3  PHQ - 2 Score 0 5 5  Altered sleeping 0 3 3  Tired, decreased energy 0 3 3  Change in appetite 1 3 3   Feeling bad or failure about yourself  0 3 3  Trouble concentrating 1 3 3   Moving slowly or fidgety/restless 0 3 3  Suicidal thoughts 0 2 2  PHQ-9 Score 2 25 25   Difficult doing work/chores - - Very difficult    Depression screen [facility] 2/9 11/15/2017 11/01/2017 10/15/2017  Decreased Interest 0 2 2  Down, Depressed, Hopeless 0 3 3  PHQ - 2 Score 0 5 5  Altered sleeping 0 3 3  Tired, decreased energy 0 3 3  Change in appetite 1 3 3   Feeling bad or failure about yourself  0 3 3  Trouble concentrating 1 3 3   Moving slowly or fidgety/restless 0 3 3  Suicidal thoughts 0 2 2  PHQ-9 Score 2 25 25   Difficult doing work/chores - - Very difficult     Assessment: 27 y.o. G3P1021 shot interval follow up anxiety and depression  Plan: Problem List Items Addressed This Visit    None      1) PHQ-9 2 and GAD-7 9.  Continue Wellbutrin at current dose  2) Thyroid and B12 screen has been obtained previously  3) Return in about 4 weeks (around 12/13/2017) for Medication.    Vena Austria, MD, [facility], [facility] 11/22/2017, 8:15 AM

## 2017-12-13 ENCOUNTER — Encounter: Payer: Self-pay | Admitting: Obstetrics and Gynecology

## 2017-12-13 ENCOUNTER — Ambulatory Visit (INDEPENDENT_AMBULATORY_CARE_PROVIDER_SITE_OTHER): Payer: Managed Care, Other (non HMO) | Admitting: Obstetrics and Gynecology

## 2017-12-13 VITALS — BP 116/68 | HR 92 | Ht 63.0 in | Wt 202.0 lb

## 2017-12-13 DIAGNOSIS — F419 Anxiety disorder, unspecified: Secondary | ICD-10-CM

## 2017-12-13 DIAGNOSIS — F329 Major depressive disorder, single episode, unspecified: Secondary | ICD-10-CM | POA: Diagnosis not present

## 2017-12-13 DIAGNOSIS — F32A Depression, unspecified: Secondary | ICD-10-CM

## 2017-12-13 MED ORDER — BUPROPION HCL ER (XL) 300 MG PO TB24: 300.0000 mg | ORAL_TABLET | Freq: Every day | ORAL | 3 refills | Status: DC

## 2017-12-13 NOTE — Progress Notes (Signed)
Obstetrics & Gynecology Office Visit   Chief Complaint:  Chief Complaint  Patient presents with  . Follow-up    anxiety/depression    History of Present Illness: The patient is a 27 y.o. female presenting follow up for symptoms of anxiety and depression.  The patient is currently taking Wellbutrin 150mg  for the management of her symptoms.  She has had recent situational stressors.  This is the first holiday season without her father so that naturally has brought up some additional emotions for the patient.  She reports symptoms of anhedonia, irritability, agorophobia and suicidal ideation.  Denies specific plan still and states improved since prior visits.  She denies feelings of guilt, feelings of worthlessness, homicidal ideation, auditory hallucinations and visual hallucinations. Symptoms have improved since last visit.     The patient does have a pre-existing history of depression and anxiety.  She  does not a prior history of suicide attempts.    Review of Systems: Review of Systems  Constitutional: Negative.   Gastrointestinal: Negative for nausea and vomiting.  Neurological: Negative for headaches.  Psychiatric/Behavioral: Negative.     Past Medical History:  Past Medical History:  Diagnosis Date  . HSV-2 infection 2014    Past Surgical History:  Past Surgical History:  Procedure Laterality Date  . CHOLECYSTECTOMY, LAPAROSCOPIC  2017    Gynecologic History: Patient's last menstrual period was 12/08/2017 (exact date).  Obstetric History: [id number]  Family History:  Family History  Problem Relation Age of Onset  . Heart attack Father   . Heart attack Paternal Grandmother   . Heart attack Paternal Grandfather     Social History:  Social History   Socioeconomic History  . Marital status: Single    Spouse name: Not on file  . Number of children: Not on file  . Years of education: Not on file  . Highest education level: Not on file  Occupational History  .  Not on file  Social Needs  . Financial resource strain: Not on file  . Food insecurity:    Worry: Not on file    Inability: Not on file  . Transportation needs:    Medical: Not on file    Non-medical: Not on file  Tobacco Use  . Smoking status: Never Smoker  . Smokeless tobacco: Never Used  Substance and Sexual Activity  . Alcohol use: Yes    Comment: occasionally   . Drug use: Never  . Sexual activity: Yes    Birth control/protection: None  Lifestyle  . Physical activity:    Days per week: Not on file    Minutes per session: Not on file  . Stress: Not on file  Relationships  . Social connections:    Talks on phone: Not on file    Gets together: Not on file    Attends religious service: Not on file    Active member of club or organization: Not on file    Attends meetings of clubs or organizations: Not on file    Relationship status: Not on file  . Intimate partner violence:    Fear of current or ex partner: Not on file    Emotionally abused: Not on file    Physically abused: Not on file    Forced sexual activity: Not on file  Other Topics Concern  . Not on file  Social History Narrative  . Not on file    Allergies:  No Known Allergies  Medications: Prior to Admission medications  Medication Sig Start Date End Date Taking? Authorizing Provider  buPROPion (WELLBUTRIN XL) 300 MG 24 hr tablet Take 1 tablet (300 mg total) by mouth daily. 12/13/17  Yes Vena Austria, MD  etonogestrel-ethinyl estradiol (NUVARING) 0.12-0.015 MG/24HR vaginal ring Insert vaginally and leave in place for 3 consecutive weeks, then remove for 1 week. 10/15/17  Yes Vena Austria, MD    Physical Exam Vitals:  Vitals:   12/13/17 1332  BP: 116/68  Pulse: 92   Patient's last menstrual period was 12/08/2017 (exact date).  General: NAD HEENT: normocephalic, anicteric Pulmonary: No increased work of breathing Neurologic: Grossly intact Psychiatric: mood appropriate, affect  full    GAD 7 : Generalized Anxiety Score 12/13/2017 11/15/2017 11/01/2017 10/15/2017  Nervous, Anxious, on Edge 1 1 2 3   Control/stop worrying 2 1 2 3   Worry too much - different things 1 1 2 3   Trouble relaxing 1 1 2 3   Restless 1 1 2 3   Easily annoyed or irritable 2 2 3 3   Afraid - awful might happen 1 2 3 3   Total GAD 7 Score 9 9 16 21   Anxiety Difficulty Somewhat difficult Somewhat difficult Very difficult Very difficult    Depression screen [facility] 2/9 12/13/2017 11/15/2017 11/01/2017  Decreased Interest 1 0 2  Down, Depressed, Hopeless 2 0 3  PHQ - 2 Score 3 0 5  Altered sleeping 2 0 3  Tired, decreased energy 1 0 3  Change in appetite 2 1 3   Feeling bad or failure about yourself  1 0 3  Trouble concentrating 1 1 3   Moving slowly or fidgety/restless 1 0 3  Suicidal thoughts 2 0 2  PHQ-9 Score 13 2 25   Difficult doing work/chores Somewhat difficult - -    Depression screen [facility] 2/9 12/13/2017 11/15/2017 11/01/2017 10/15/2017  Decreased Interest 1 0 2 2  Down, Depressed, Hopeless 2 0 3 3  PHQ - 2 Score 3 0 5 5  Altered sleeping 2 0 3 3  Tired, decreased energy 1 0 3 3  Change in appetite 2 1 3 3   Feeling bad or failure about yourself  1 0 3 3  Trouble concentrating 1 1 3 3   Moving slowly or fidgety/restless 1 0 3 3  Suicidal thoughts 2 0 2 2  PHQ-9 Score 13 2 25 25   Difficult doing work/chores Somewhat difficult - - Very difficult     Assessment: 27 y.o. Catherine [name] follow up anxiety and depression  Plan: Problem List Items Addressed This Visit    None    Visit Diagnoses    Anxiety and depression    -  Primary   Relevant Medications   buPROPion (WELLBUTRIN XL) 300 MG 24 hr tablet      1) She states she is feeling better on the Welbutrin then before but slightly more stressed with holidays.  Will increase wellbutrin to 300 during holidays.  Her dad passed away in 03-01-2022 so will re-scale her then - PHQ-9 is 13 item 10 is 2 no plan  2) Thyroid and B12 screen has been  obtained previously   Vena Austria, MD, [facility], [facility]

## 2018-03-02 NOTE — Telephone Encounter (Signed)
Follow up sometime this week or next

## 2018-03-07 ENCOUNTER — Ambulatory Visit (INDEPENDENT_AMBULATORY_CARE_PROVIDER_SITE_OTHER): Payer: Managed Care, Other (non HMO) | Admitting: Obstetrics and Gynecology

## 2018-03-07 ENCOUNTER — Encounter: Payer: Self-pay | Admitting: Obstetrics and Gynecology

## 2018-03-07 VITALS — BP 124/82 | HR 74 | Ht 64.0 in | Wt 188.0 lb

## 2018-03-07 DIAGNOSIS — F332 Major depressive disorder, recurrent severe without psychotic features: Secondary | ICD-10-CM | POA: Diagnosis not present

## 2018-03-07 MED ORDER — BUPROPION HCL ER (XL) 450 MG PO TB24: 450.0000 mg | ORAL_TABLET | Freq: Every day | ORAL | 3 refills | Status: DC

## 2018-03-07 NOTE — Progress Notes (Signed)
Obstetrics & Gynecology Office Visit   Chief Complaint:  Chief Complaint  Patient presents with  . Follow-up    has been having a lot of heart burn for the last month    History of Present Illness: The patient is a 28 y.o. female presenting follow up for symptoms of anxiety and depression.  The patient is currently taking Wellbutrin XR 150mg  for the management of her symptoms.  She has had any recent situational stressors.  Undergoing separation and behavior issues with her son.  She reports symptoms of anhedonia, day time somnolence, insomnia, irritability, social anxiety, agorophobia, feelings of guilt, feelings of worthlessness and suicidal ideation.  She denies risk taking behavior, homicidal ideation, auditory hallucinations and visual hallucinations. Symptoms have worsened since last visit.     The patient does have a pre-existing history of depression and anxiety.   I asked her about her SI and she states she has thoughts that things would be better if she wasn't here but she denies having formulated specific plans, acting on these thoughts, or a history of prior suicide attempts.  Review of Systems: Review of Systems  Constitutional: Negative.   Gastrointestinal: Negative for nausea and vomiting.  Neurological: Negative for headaches.  Psychiatric/Behavioral: Positive for depression and suicidal ideas. Negative for hallucinations, memory loss and substance abuse. The patient is nervous/anxious and has insomnia.     Past Medical History:  Past Medical History:  Diagnosis Date  . HSV-2 infection 2014    Past Surgical History:  Past Surgical History:  Procedure Laterality Date  . CHOLECYSTECTOMY, LAPAROSCOPIC  2017    Gynecologic History: Patient's last menstrual period was 03/05/2018 (exact date).  Obstetric History: [id number]  Family History:  Family History  Problem Relation Age of Onset  . Heart attack Father   . Heart attack Paternal Grandmother   . Heart  attack Paternal Grandfather     Social History:  Social History   Socioeconomic History  . Marital status: Single    Spouse name: Not on file  . Number of children: Not on file  . Years of education: Not on file  . Highest education level: Not on file  Occupational History  . Not on file  Social Needs  . Financial resource strain: Not on file  . Food insecurity:    Worry: Not on file    Inability: Not on file  . Transportation needs:    Medical: Not on file    Non-medical: Not on file  Tobacco Use  . Smoking status: Never Smoker  . Smokeless tobacco: Never Used  Substance and Sexual Activity  . Alcohol use: Yes    Comment: occasionally   . Drug use: Never  . Sexual activity: Yes    Birth control/protection: Inserts    Comment: Nuvaring  Lifestyle  . Physical activity:    Days per week: Not on file    Minutes per session: Not on file  . Stress: Not on file  Relationships  . Social connections:    Talks on phone: Not on file    Gets together: Not on file    Attends religious service: Not on file    Active member of club or organization: Not on file    Attends meetings of clubs or organizations: Not on file    Relationship status: Not on file  . Intimate partner violence:    Fear of current or ex partner: Not on file    Emotionally abused: Not on file  Physically abused: Not on file    Forced sexual activity: Not on file  Other Topics Concern  . Not on file  Social History Narrative  . Not on file    Allergies:  No Known Allergies  Medications: Prior to Admission medications   Medication Sig Start Date End Date Taking? Authorizing Provider  buPROPion 450 MG TB24 Take 450 mg by mouth daily. 03/07/18  Yes Vena Austria, MD  etonogestrel-ethinyl estradiol (NUVARING) 0.12-0.015 MG/24HR vaginal ring Insert vaginally and leave in place for 3 consecutive weeks, then remove for 1 week. 10/15/17  Yes Vena Austria, MD    Physical Exam Vitals:  Vitals:     03/07/18 1605  BP: 124/82  Pulse: 74   Patient's last menstrual period was 03/05/2018 (exact date).  General: NAD, strong odor of cannabis HEENT: normocephalic, anicteric Pulmonary: No increased work of breathing Neurologic: Grossly intact Psychiatric: mood tearfull, affect flat   GAD 7 : Generalized Anxiety Score 03/07/2018 12/13/2017 11/15/2017 11/01/2017  Nervous, Anxious, on Edge 2 1 1 2   Control/stop worrying 2 2 1 2   Worry too much - different things 2 1 1 2   Trouble relaxing 2 1 1 2   Restless 2 1 1 2   Easily annoyed or irritable 2 2 2 3   Afraid - awful might happen 3 1 2 3   Total GAD 7 Score 15 9 9 16   Anxiety Difficulty - Somewhat difficult Somewhat difficult Very difficult    Depression screen [facility] 2/9 03/07/2018 12/13/2017 11/15/2017  Decreased Interest 2 1 0  Down, Depressed, Hopeless 3 2 0  PHQ - 2 Score 5 3 0  Altered sleeping 3 2 0  Tired, decreased energy 2 1 0  Change in appetite 3 2 1   Feeling bad or failure about yourself  3 1 0  Trouble concentrating 2 1 1   Moving slowly or fidgety/restless 2 1 0  Suicidal thoughts 2 2 0  PHQ-9 Score 22 13 2   Difficult doing work/chores - Somewhat difficult -    Depression screen [facility] 2/9 03/07/2018 12/13/2017 11/15/2017 11/01/2017 10/15/2017  Decreased Interest 2 1 0 2 2  Down, Depressed, Hopeless 3 2 0 3 3  PHQ - 2 Score 5 3 0 5 5  Altered sleeping 3 2 0 3 3  Tired, decreased energy 2 1 0 3 3  Change in appetite 3 2 1 3 3   Feeling bad or failure about yourself  3 1 0 3 3  Trouble concentrating 2 1 1 3 3   Moving slowly or fidgety/restless 2 1 0 3 3  Suicidal thoughts 2 2 0 2 2  PHQ-9 Score 22 13 2 25 25   Difficult doing work/chores - Somewhat difficult - - Very difficult     Assessment: 28 y.o. [id number] major depression recurrent  Plan: Problem List Items Addressed This Visit    None    Visit Diagnoses    Severe episode of recurrent major depressive disorder, without psychotic features (HCC)    -  Primary    Relevant Medications   buPROPion 450 MG TB24   Other Relevant Orders   Ambulatory referral to Psychiatry      1) Depression - acute exacerbation with recent stressors including seperation as well as behavior issues with her son.  Encouraged to establish with local counselor and given list.  List of local psychiatric resources including walk in clinics provided (national suicide prevention hot line as well as crisis text line).   - PHQ-9 is 22 item 9 is  2 - GAD 7 is 15  - Increase Wellbutrin XR to 450mg  - Referral to psychiatry  2) Thyroid and B12 screen has been obtained previously   Vena Austria, MD, [facility], [facility]

## 2018-03-07 NOTE — Patient Instructions (Signed)
Below is a list of resources that you may find helpful. Please tell someone if you begin to have suicidal or homicidal thoughts and seek immediate medical help. Staying Safe:    FOLLOW YOUR CRISIS PLAN   National Suicide Prevention Lifeline: 867-521-7884 Len Childs)  Crisis text line (947)013-7320   Please refrain from using alcohol or illicit substances, such as marijuana, cocaine, etc, as these can affect your mood and could cause anxiety or other concerning symptoms.   REMOVE ALL firearms from your home. Lock medications up.   Seek further medical care for any increase in symptoms or new symptoms, such as thoughts of wanting to hurt yourself, hurt others, or if you have increased agitation.   For immediate concerns, call 911 for emergency medical attention.   Do not make changes to your medications, including taking more or less than prescribed, unless under the supervision of your provider. Be aware that some medications may make you feel worse if abruptly stopped.  ++LOCAL WALK-IN CLINICS and 24/7 CRISIS CENTER++  [facility], 24/7 Address: [address], [place], [place] [address]  Phone: [phone]  ++[place]++ --[facility], 24/7 [address] [place]  (24 hours a day)  --[place] Outreach: Psychiatric Urgent Care  Ages 4 and over with psychiatric or substance use crisis [address] [place], [place] [address] Monday - Thursday: 8:00am - 7:00pm; Friday: 8:00am - 3:00pm  Saturday: 9:00am - 12:00pm, CLOSED Sundays [place], medicaid from [place], [place], [place], [place]  ++[place]++ Crisis and Assessment, 24/7 [facility] Health Care at [facility] [address] [place]  (24 hours a day)  ++[place] [facility] [facility] Mental Health Division, [place] Russel. Health Department [address] [place], [place]  (Monday-Friday, 8:00am-5:00pm)  ++[place]++ Community Mental Health  Center at [facility] [address] [place]  (7 days a week, 8:00am-10:00pm)  ++MOBILE CRISIS ++ The Mobile Crisis Response Team provides a number of integrated services including: . Evaluation of immediate need for crisis/emergency services . Crisis intervention and assistance in de-escalating the situation . Risk assessment and evaluation for more intensive services  . Short-term crisis management until other supports/services are in place  . Liaison between individual, families, providers and emergency services  . Coordination of appropriate services for ongoing treatment and follow up  LOCAL MOBILE CRISIS TEAMS:  1) [place] via [facility] [phone]  2) [place], [place], [place] and [facility] Access by [employer]: [phone]  2) [place] via Psycho-therapeutic [phone]  Suicide & Crisis Hotlines  The Hopeline   Call or text: 502 178 3706  Online chat: www.hopeline.com  [employer]: 1-800-SUICIDE / 6165551803  National Suicide Prevention Lifeline 606-658-5935 / (609) 415-4367  Online chat: www.suicidepreventionlifeline.org  Substance Abuse Treatment  Alcohol/Drug Council of [place] 9-678-938-1017  http://[email or web address]   Substance Abuse and Mental Health Services Administration [facility]) treatment locator [email or web address]   [facility]'s Alcohol and Substance Abuse Program (ASAP) All major health insurance, Medicare, and Medicaid are accepted 661 617 6202 www.[facility].org/un[facility]c/care-treatment/alcohol-and-substance-abuse/  [facility]: 336-224-6329  http://freedom[facility]recovery.org/   [facility] Recovery Response Center: 402 528 6186  Mobile Crisis in [place]: https://www.hunter.org/ -- search for your county  [facility] DEPARTMENT OF PSYCHIATRY: https://www.[email or web address]/  [facility] Psychiatry:  The Acute Diagnostic and Treatment Clinic (ADTC) is a general adult psychiatric clinic staffed by teams of faculty and resident psychiatrists. For appointments, please call [phone].   [address] - [place] Child & Adolescent Psychiatry: To obtain an appointment for your child to be  seen in our clinic, please call [phone].   [address] - [facility] Child Psychiatry Outpatient Program, [facility] Psychiatry Pediatric Behavior Clinic, [facility] Faculty Physicians and [facility] Parenting Initiative Clinic: To obtain an appointment at one of these clinics, please call [phone].   [facility] Center of Excellence for Eating Disorders: [phone]   [facility] for Women's Mood Disorders: [phone]  How to Find Mental Health Treatment If you have health insurance, look on your insurance card for the phone number or website to access a list of mental health providers who accept your insurance.  If you have Medicaid, Medicare or no insurance, see below for your county's MCO. Call the access number to schedule an appointment.   For the most up-to-date information, please visit: http://www.price-[email or web address]  [facility] Counties served [place] 24/7 Access: [phone]  www.[email or web address].[email or web address], medical [place] [facility] [place]vie [place] 24/7 Access:  [phone]  www.[email or web address].[facility] [place] [facility] [place] [facility] [place] 24/7 Access: [phone]  www.[email or web address]  [facility] [place]   [place] Access: [phone]  TTY: [phone]  www.[email or web address]  [employer] Health Management [facility] [place] Francesco [place] 24/7 Access: [phone]  www.[email or web address]  [facility] [place] [address] 24/7 Access: [phone]  [email or web address]  [facility] Health Resources [place] [facility] [place] 24/7 Access: [phone]  [email or web address]

## 2018-03-14 ENCOUNTER — Ambulatory Visit: Payer: Managed Care, Other (non HMO) | Admitting: Obstetrics and Gynecology

## 2018-03-21 ENCOUNTER — Encounter: Payer: Self-pay | Admitting: Obstetrics and Gynecology

## 2018-03-21 ENCOUNTER — Ambulatory Visit (INDEPENDENT_AMBULATORY_CARE_PROVIDER_SITE_OTHER): Payer: Managed Care, Other (non HMO) | Admitting: Obstetrics and Gynecology

## 2018-03-21 VITALS — BP 130/72 | HR 77 | Wt 191.0 lb

## 2018-03-21 DIAGNOSIS — F3341 Major depressive disorder, recurrent, in partial remission: Secondary | ICD-10-CM

## 2018-03-21 MED ORDER — BUPROPION HCL ER (XL) 450 MG PO TB24: 450.0000 mg | ORAL_TABLET | Freq: Every day | ORAL | 3 refills | Status: DC

## 2018-03-21 NOTE — Progress Notes (Signed)
Obstetrics & Gynecology Office Visit   Chief Complaint:  Chief Complaint  Patient presents with  . Follow-up    depression    History of Present Illness: The patient is a 28 y.o. female presenting follow up for symptoms of depression.  The patient is currently taking Wellbutrin XR increased to 450mg  last visit for the management of her symptoms.  She has had any recent situational stressors, separation, behavior problems with son, recently let go at work.  She reports symptoms of anhedonia, irritability and social anxiety.  She denies risk taking behavior, agorophobia, feelings of guilt, feelings of worthlessness, suicidal ideation, homicidal ideation, auditory hallucinations and visual hallucinations. Symptoms have improved since last visit.     The patient does have a pre-existing history of depression and anxiety.  She  does not a prior history of suicide attempts.    Review of Systems: Review of Systems  Constitutional: Negative.   Gastrointestinal: Negative for nausea.  Neurological: Negative for headaches.  Psychiatric/Behavioral: Positive for depression. Negative for hallucinations, memory loss, substance abuse and suicidal ideas. The patient is nervous/anxious. The patient does not have insomnia.      Past Medical History:  Past Medical History:  Diagnosis Date  . HSV-2 infection 2014    Past Surgical History:  Past Surgical History:  Procedure Laterality Date  . CHOLECYSTECTOMY, LAPAROSCOPIC  2017    Gynecologic History: Patient's last menstrual period was 03/05/2018 (exact date).  Obstetric History: [id number]  Family History:  Family History  Problem Relation Age of Onset  . Heart attack Father   . Heart attack Paternal Grandmother   . Heart attack Paternal Grandfather     Social History:  Social History   Socioeconomic History  . Marital status: Single    Spouse name: Not on file  . Number of children: Not on file  . Years of education: Not on file   . Highest education level: Not on file  Occupational History  . Not on file  Social Needs  . Financial resource strain: Not on file  . Food insecurity:    Worry: Not on file    Inability: Not on file  . Transportation needs:    Medical: Not on file    Non-medical: Not on file  Tobacco Use  . Smoking status: Never Smoker  . Smokeless tobacco: Never Used  Substance and Sexual Activity  . Alcohol use: Yes    Comment: occasionally   . Drug use: Never  . Sexual activity: Yes    Birth control/protection: Inserts    Comment: Nuvaring  Lifestyle  . Physical activity:    Days per week: Not on file    Minutes per session: Not on file  . Stress: Not on file  Relationships  . Social connections:    Talks on phone: Not on file    Gets together: Not on file    Attends religious service: Not on file    Active member of club or organization: Not on file    Attends meetings of clubs or organizations: Not on file    Relationship status: Not on file  . Intimate partner violence:    Fear of current or ex partner: Not on file    Emotionally abused: Not on file    Physically abused: Not on file    Forced sexual activity: Not on file  Other Topics Concern  . Not on file  Social History Narrative  . Not on file    Allergies:  No Known Allergies  Medications: Prior to Admission medications   Medication Sig Start Date End Date Taking? Authorizing Provider  buPROPion HCl ER, XL, 450 MG TB24 Take 450 mg by mouth daily. 03/21/18   Vena Austria, MD  etonogestrel-ethinyl estradiol (NUVARING) 0.12-0.015 MG/24HR vaginal ring Insert vaginally and leave in place for 3 consecutive weeks, then remove for 1 week. 10/15/17   Vena Austria, MD    Physical Exam Vitals:  Vitals:   03/21/18 1631  BP: 130/72  Pulse: 77   Patient's last menstrual period was 03/05/2018 (exact date).  General: NAD HEENT: normocephalic, anicteric Pulmonary: No increased work of breathing Neurologic:  Grossly intact Psychiatric: mood appropriate, affect full    GAD 7 : Generalized Anxiety Score 03/07/2018 12/13/2017 11/15/2017 11/01/2017  Nervous, Anxious, on Edge 2 1 1 2   Control/stop worrying 2 2 1 2   Worry too much - different things 2 1 1 2   Trouble relaxing 2 1 1 2   Restless 2 1 1 2   Easily annoyed or irritable 2 2 2 3   Afraid - awful might happen 3 1 2 3   Total GAD 7 Score 15 9 9 16   Anxiety Difficulty - Somewhat difficult Somewhat difficult Very difficult    Depression screen [facility] 2/9 03/21/2018 03/07/2018 12/13/2017  Decreased Interest 1 2 1   Down, Depressed, Hopeless 1 3 2   PHQ - 2 Score 2 5 3   Altered sleeping 2 3 2   Tired, decreased energy 1 2 1   Change in appetite 2 3 2   Feeling bad or failure about yourself  1 3 1   Trouble concentrating 2 2 1   Moving slowly or fidgety/restless 1 2 1   Suicidal thoughts 1 2 2   PHQ-9 Score 12 22 13   Difficult doing work/chores - - Somewhat difficult    Depression screen [facility] 2/9 03/21/2018 03/07/2018 12/13/2017 11/15/2017 11/01/2017  Decreased Interest 1 2 1  0 2  Down, Depressed, Hopeless 1 3 2  0 3  PHQ - 2 Score 2 5 3  0 5  Altered sleeping 2 3 2  0 3  Tired, decreased energy 1 2 1  0 3  Change in appetite 2 3 2 1 3   Feeling bad or failure about yourself  1 3 1  0 3  Trouble concentrating 2 2 1 1 3   Moving slowly or fidgety/restless 1 2 1  0 3  Suicidal thoughts 1 2 2  0 2  PHQ-9 Score 12 22 13 2 25   Difficult doing work/chores - - Somewhat difficult - -     Assessment: 28 y.o. [id number] follow up depression  Plan: Problem List Items Addressed This Visit    None    Visit Diagnoses    Recurrent major depressive disorder, in partial remission (HCC)    -  Primary   Relevant Medications   buPROPion HCl ER, XL, 450 MG TB24      1) Depression -  Good improvement to Wellbutrin dose increase - PHQ-9 is down to 11 doing well  - Continue to work on social support and coping strategies.  Psychiatry referral was initiated last visit but  loss of insurance secondary to being laid of work  2) Thyroid and B12 screen has been obtained previously  3) A total of 15 minutes were spent in face-to-face contact with the patient during this encounter with over half of that time devoted to counseling and coordination of care.  4) Return in about 3 months (around 06/21/2018) for medication follow up.    Vena Austria, MD, [facility],  [facility]  

## 2018-05-09 ENCOUNTER — Other Ambulatory Visit: Payer: Self-pay | Admitting: Obstetrics and Gynecology

## 2018-05-09 MED ORDER — NITROFURANTOIN MONOHYD MACRO 100 MG PO CAPS: 100.0000 mg | ORAL_CAPSULE | Freq: Two times a day (BID) | ORAL | 0 refills | Status: AC

## 2018-06-22 ENCOUNTER — Ambulatory Visit: Payer: Managed Care, Other (non HMO) | Admitting: Obstetrics and Gynecology

## 2018-08-08 ENCOUNTER — Other Ambulatory Visit: Payer: Self-pay

## 2018-08-08 DIAGNOSIS — K649 Unspecified hemorrhoids: Secondary | ICD-10-CM | POA: Diagnosis present

## 2018-08-08 DIAGNOSIS — K644 Residual hemorrhoidal skin tags: Secondary | ICD-10-CM | POA: Insufficient documentation

## 2018-08-08 DIAGNOSIS — Z79899 Other long term (current) drug therapy: Secondary | ICD-10-CM | POA: Insufficient documentation

## 2018-08-08 NOTE — ED Triage Notes (Signed)
Pt reports pain with external hemidrosis since Saturday. Pt sts pain has increased and OTC cream/wipes are not relieving. Pt denies recent anal sex as well as constipation.

## 2018-08-09 ENCOUNTER — Emergency Department
Admission: EM | Admit: 2018-08-09 | Discharge: 2018-08-09 | Disposition: A | Discharge status: Home / Self Care | Payer: Medicaid Other | Attending: Emergency Medicine | Admitting: Emergency Medicine

## 2018-08-09 DIAGNOSIS — K649 Unspecified hemorrhoids: Secondary | ICD-10-CM

## 2018-08-09 MED ORDER — KETOROLAC TROMETHAMINE 60 MG/2ML IM SOLN
60.0000 mg | Freq: Once | INTRAMUSCULAR | Status: AC
  Administered 2018-08-09: 60 mg via INTRAMUSCULAR
  Filled 2018-08-09: qty 2

## 2018-08-09 MED ORDER — IBUPROFEN 800 MG PO TABS: 800.0000 mg | ORAL_TABLET | Freq: Three times a day (TID) | ORAL | 0 refills | Status: DC | PRN

## 2018-08-09 MED ORDER — OXYCODONE HCL 5 MG PO TABS
5.0000 mg | ORAL_TABLET | Freq: Once | ORAL | Status: AC
  Administered 2018-08-09: 5 mg via ORAL
  Filled 2018-08-09: qty 1

## 2018-08-09 MED ORDER — ACETAMINOPHEN 500 MG PO TABS
1000.0000 mg | ORAL_TABLET | Freq: Once | ORAL | Status: AC
  Administered 2018-08-09: 1000 mg via ORAL
  Filled 2018-08-09: qty 2

## 2018-08-09 MED ORDER — TRAMADOL HCL 50 MG PO TABS: 50.0000 mg | ORAL_TABLET | Freq: Four times a day (QID) | ORAL | 0 refills | Status: AC | PRN

## 2018-08-09 MED ORDER — LIDOCAINE VISCOUS HCL 2 % MT SOLN
15.0000 mL | Freq: Once | OROMUCOSAL | Status: AC
  Administered 2018-08-09: 15 mL via OROMUCOSAL
  Filled 2018-08-09: qty 15

## 2018-08-09 NOTE — ED Notes (Signed)
Patient c/o continued pain to rectum d/t hemorrhoids. Patient given viscous lidocaine to hemorrhoid to attempt to ease pain while waiting to see MD.

## 2018-08-09 NOTE — ED Provider Notes (Signed)
[facility] Emergency Department Provider Note  ____________________________________________  Time seen: Approximately 4:13 AM  I have reviewed the triage vital signs and the nursing notes.   HISTORY  Chief Complaint Hemorrhoids   HPI Ronnette JuniperBrianna L Bebout is a 28 y.o. female presents for evaluation of hemorrhoids.  Patient has had it for 3 days.  Has tried topical treatments such as wipes and Preparation H with no significant relief.  She is complaining of severe sharp/throbbing pain which has been constant and nonradiating.  Has had a prior episode of hemorrhoids.  Patient reports that she spends a lot of time sitting in the toilet on her phone.  She reports that her bowel movements are usually soft.  She denies abdominal pain.   Past Medical History:  Diagnosis Date  . HSV-2 infection 2014    There are no active problems to display for this patient.   Past Surgical History:  Procedure Laterality Date  . CHOLECYSTECTOMY, LAPAROSCOPIC  2017    Prior to Admission medications   Medication Sig Start Date End Date Taking? Authorizing Provider  buPROPion HCl ER, XL, 450 MG TB24 Take 450 mg by mouth daily. 03/21/18   Vena AustriaStaebler, Andreas, MD  etonogestrel-ethinyl estradiol (NUVARING) 0.12-0.015 MG/24HR vaginal ring Insert vaginally and leave in place for 3 consecutive weeks, then remove for 1 week. 10/15/17   Vena AustriaStaebler, Andreas, MD  ibuprofen (ADVIL) 800 MG tablet Take 1 tablet (800 mg total) by mouth every 8 (eight) hours as needed. 08/09/18   Nita SickleVeronese, [place], MD  traMADol (ULTRAM) 50 MG tablet Take 1 tablet (50 mg total) by mouth every 6 (six) hours as needed. 08/09/18 08/09/19  Nita SickleVeronese, [place], MD    Allergies Patient has no known allergies.  Family History  Problem Relation Age of Onset  . Heart attack Father   . Heart attack Paternal Grandmother   . Heart attack Paternal Grandfather     Social History Social History   Tobacco Use  . Smoking  status: Never Smoker  . Smokeless tobacco: Never Used  Substance Use Topics  . Alcohol use: Yes    Comment: occasionally   . Drug use: Never    Review of Systems  Constitutional: Negative for fever. Eyes: Negative for visual changes. ENT: Negative for sore throat. Neck: No neck pain  Cardiovascular: Negative for chest pain. Respiratory: Negative for shortness of breath. Gastrointestinal: Negative for abdominal pain, vomiting or diarrhea. Genitourinary: Negative for dysuria. + hemorrhoids Musculoskeletal: Negative for back pain. Skin: Negative for rash. Neurological: Negative for headaches, weakness or numbness. Psych: No SI or HI  ____________________________________________   PHYSICAL EXAM:  VITAL SIGNS: ED Triage Vitals [08/08/18 2216]  Enc Vitals Group     BP (!) 114/56     Pulse Rate 98     Resp 18     Temp 98.6 F (37 C)     Temp Source Oral     SpO2 100 %     Weight      Height      Head Circumference      Peak Flow      Pain Score      Pain Loc      Pain Edu?      Excl. in GC?     Constitutional: Alert and oriented. Well appearing and in no apparent distress. HEENT:      Head: Normocephalic and atraumatic.         Eyes: Conjunctivae are normal. Sclera is non-icteric.  Mouth/Throat: Mucous membranes are moist.       Neck: Supple with no signs of meningismus. Cardiovascular: Regular rate and rhythm.  Respiratory: Normal respiratory effort.  Gastrointestinal: Soft, non tender, and non distended with positive bowel sounds. No rebound or guarding. Genitourinary: External hemorrhoids with no thrombosis Neurologic: Normal speech and language. Face is symmetric. Moving all extremities. No gross focal neurologic deficits are appreciated. Skin: Skin is warm, dry and intact. No rash noted. Psychiatric: Mood and affect are normal. Speech and behavior are normal.  ____________________________________________   LABS (all labs ordered are listed, but only  abnormal results are displayed)  Labs Reviewed - No data to display ____________________________________________  EKG  none  ____________________________________________  RADIOLOGY  none  ____________________________________________   PROCEDURES  Procedure(s) performed: None Procedures Critical Care performed:  None ____________________________________________   INITIAL IMPRESSION / ASSESSMENT AND PLAN / ED COURSE   28 y.o. female presents for evaluation of hemorrhoids.  Patient with 2 external nonthrombosed hemorrhoids.  Has not tried any oral medications for the pain.  Patient was given Toradol IM, Tylenol, and 1 oxycodone.  Will discharge home on ibuprofen 800 mg, sitz bath's, stool softener, reduce time sitting in the toilet, and a short course of tramadol as needed.  Will refer patient to surgery since this is her second episode of hemorrhoidal Lancer in the emergency room.  Discussed my standard return precautions       As part of my medical decision making, I reviewed the following data within the [facility] notes reviewed and incorporated, Old chart reviewed, Notes from prior ED visits and [place] Controlled Substance Database   Patient was evaluated in Emergency Department today for the symptoms described in the history of present illness. Patient was evaluated in the context of the global COVID-19 pandemic, which necessitated consideration that the patient might be at risk for infection with the SARS-CoV-2 virus that causes COVID-19. Institutional protocols and algorithms that pertain to the evaluation of patients at risk for COVID-19 are in a state of rapid change based on information released by regulatory bodies including the CDC and federal and state organizations. These policies and algorithms were followed during the patient's care in the ED.   ____________________________________________   FINAL CLINICAL IMPRESSION(S) / ED DIAGNOSES    Final diagnoses:  Hemorrhoids, unspecified hemorrhoid type      NEW MEDICATIONS STARTED DURING THIS VISIT:  ED Discharge Orders         Ordered    ibuprofen (ADVIL) 800 MG tablet  Every 8 hours PRN     08/09/18 0412    traMADol (ULTRAM) 50 MG tablet  Every 6 hours PRN     08/09/18 0412           Note:  This document was prepared using Dragon voice recognition software and may include unintentional dictation errors.    Alfred Levins, [place], MD 08/09/18 [phone]

## 2018-08-15 ENCOUNTER — Other Ambulatory Visit: Payer: Self-pay

## 2018-08-15 MED ORDER — ETONOGESTREL-ETHINYL ESTRADIOL 0.12-0.015 MG/24HR VA RING: VAGINAL_RING | VAGINAL | 3 refills | Status: DC

## 2019-02-22 ENCOUNTER — Other Ambulatory Visit: Payer: Self-pay | Admitting: Obstetrics and Gynecology

## 2019-02-22 NOTE — Telephone Encounter (Signed)
Advise

## 2019-07-25 ENCOUNTER — Other Ambulatory Visit: Payer: Self-pay

## 2019-07-25 ENCOUNTER — Telehealth: Payer: Self-pay | Admitting: Obstetrics and Gynecology

## 2019-07-25 MED ORDER — ETONOGESTREL-ETHINYL ESTRADIOL 0.12-0.015 MG/24HR VA RING: VAGINAL_RING | VAGINAL | 0 refills | Status: DC

## 2019-07-25 NOTE — Telephone Encounter (Signed)
Pt scheduled for annual, can you send in nuva ring to get her to her appointment? shes been out for two months.Marland KitchenMarland Kitchen

## 2019-07-25 NOTE — Telephone Encounter (Signed)
Rx sent 

## 2019-08-07 ENCOUNTER — Encounter: Payer: Self-pay | Admitting: Obstetrics and Gynecology

## 2019-08-07 ENCOUNTER — Other Ambulatory Visit: Payer: Self-pay

## 2019-08-07 ENCOUNTER — Ambulatory Visit (INDEPENDENT_AMBULATORY_CARE_PROVIDER_SITE_OTHER): Payer: Medicaid Other | Admitting: Obstetrics and Gynecology

## 2019-08-07 VITALS — BP 100/70 | Ht 63.0 in | Wt 186.0 lb

## 2019-08-07 DIAGNOSIS — Z1239 Encounter for other screening for malignant neoplasm of breast: Secondary | ICD-10-CM

## 2019-08-07 DIAGNOSIS — Z3044 Encounter for surveillance of vaginal ring hormonal contraceptive device: Secondary | ICD-10-CM | POA: Diagnosis not present

## 2019-08-07 DIAGNOSIS — Z01419 Encounter for gynecological examination (general) (routine) without abnormal findings: Secondary | ICD-10-CM

## 2019-08-07 MED ORDER — ETONOGESTREL-ETHINYL ESTRADIOL 0.12-0.015 MG/24HR VA RING: VAGINAL_RING | VAGINAL | 3 refills | Status: DC

## 2019-08-07 NOTE — Progress Notes (Signed)
Gynecology Annual Exam   PCP: Patient, No Pcp Per  Chief Complaint:  Chief Complaint  Patient presents with  . Gynecologic Exam    History of Present Illness: Patient is a 29 y.o. Catherine [name] presents for annual exam. The patient has no complaints today.   LMP: Patient's last menstrual period was 07/24/2019 (exact date). Average Interval: regular, 28 days Duration of flow: 5 days Heavy Menses: no Clots: no Intermenstrual Bleeding: no Postcoital Bleeding: no Dysmenorrhea: no  The patient is sexually active. She currently uses NuvaRing vaginal inserts for contraception. She denies dyspareunia.  The patient does perform self breast exams.  There is no notable family history of breast or ovarian cancer in her family.  The patient wears seatbelts: yes.   The patient has regular exercise: not asked.    The patient denies current symptoms of depression.    Review of Systems: Review of Systems  Constitutional: Negative for chills and fever.  HENT: Negative for congestion.   Respiratory: Negative for cough and shortness of breath.   Cardiovascular: Negative for chest pain and palpitations.  Gastrointestinal: Negative for abdominal pain, constipation, diarrhea, heartburn, nausea and vomiting.  Genitourinary: Negative for dysuria, frequency and urgency.  Skin: Negative for itching and rash.  Neurological: Negative for dizziness and headaches.  Endo/Heme/Allergies: Negative for polydipsia.  Psychiatric/Behavioral: Negative for depression.    Past Medical History:  There are no problems to display for this patient.   Past Surgical History:  Past Surgical History:  Procedure Laterality Date  . CHOLECYSTECTOMY, LAPAROSCOPIC  2017    Gynecologic History:  Patient's last menstrual period was 07/24/2019 (exact date). Contraception: NuvaRing vaginal inserts Last Pap: Results were:10/15/2017 no abnormalities   Obstetric History: [id number]  Family History:  Family History    Problem Relation Age of Onset  . Heart attack Father   . Heart attack Paternal Grandmother   . Heart attack Paternal Grandfather     Social History:  Social History   Socioeconomic History  . Marital status: Single    Spouse name: Not on file  . Number of children: Not on file  . Years of education: Not on file  . Highest education level: Not on file  Occupational History  . Not on file  Tobacco Use  . Smoking status: Never Smoker  . Smokeless tobacco: Never Used  Vaping Use  . Vaping Use: Never used  Substance and Sexual Activity  . Alcohol use: Yes    Comment: occasionally   . Drug use: Never  . Sexual activity: Yes    Birth control/protection: Inserts    Comment: Nuvaring  Other Topics Concern  . Not on file  Social History Narrative  . Not on file   Social Determinants of Health   Financial Resource Strain:   . Difficulty of Paying Living Expenses:   Food Insecurity:   . Worried About Programme researcher, broadcasting/film/video in the Last Year:   . Barista in the Last Year:   Transportation Needs:   . Freight forwarder (Medical):   Marland Kitchen Lack of Transportation (Non-Medical):   Physical Activity:   . Days of Exercise per Week:   . Minutes of Exercise per Session:   Stress:   . Feeling of Stress :   Social Connections:   . Frequency of Communication with Friends and Family:   . Frequency of Social Gatherings with Friends and Family:   . Attends Religious Services:   . Active Member of Clubs  or Organizations:   . Attends Banker Meetings:   Marland Kitchen Marital Status:   Intimate Partner Violence:   . Fear of Current or Ex-Partner:   . Emotionally Abused:   Marland Kitchen Physically Abused:   . Sexually Abused:     Allergies:  No Known Allergies  Medications: Prior to Admission medications   Medication Sig Start Date End Date Taking? Authorizing Provider  etonogestrel-ethinyl estradiol (ELURYNG) 0.12-0.015 MG/24HR vaginal ring INSERT VAGINALLY AND LEAVE IN PLACE FOR 3  CONSECUTIVE WEEKS, THEN REMOVE FOR 1 WEEK 07/25/19  Yes Vena Austria, MD  ibuprofen (ADVIL) 800 MG tablet Take 1 tablet (800 mg total) by mouth every 8 (eight) hours as needed. 08/09/18  Yes Don Perking, [place], MD  traMADol (ULTRAM) 50 MG tablet Take 1 tablet (50 mg total) by mouth every 6 (six) hours as needed. 08/09/18 08/09/19 Yes Nita Sickle, MD    Physical Exam Vitals: Blood pressure 100/Catherine, height 5\' 3"  (1.6 m), weight 186 lb (84.4 kg), last menstrual period 07/24/2019.  General: NAD HEENT: normocephalic, anicteric Thyroid: no enlargement, no palpable nodules Pulmonary: No increased work of breathing, CTAB Cardiovascular: RRR, distal pulses 2+ Breast: Breast symmetrical, no tenderness, no palpable nodules or masses, no skin or nipple retraction present, no nipple discharge.  No axillary or supraclavicular lymphadenopathy. Abdomen: NABS, soft, non-tender, non-distended.  Umbilicus without lesions.  No hepatomegaly, splenomegaly or masses palpable. No evidence of hernia  Genitourinary:  External: Normal external female genitalia.  Normal urethral meatus, normal Bartholin's and Skene's glands.    Vagina: Normal vaginal mucosa, no evidence of prolapse.    Cervix: Grossly normal in appearance, no bleeding  Uterus: Non-enlarged, mobile, normal contour.  No CMT  Adnexa: ovaries non-enlarged, no adnexal masses  Rectal: deferred  Lymphatic: no evidence of inguinal lymphadenopathy Extremities: no edema, erythema, or tenderness Neurologic: Grossly intact Psychiatric: mood appropriate, affect full  Female chaperone present for pelvic and breast  portions of the physical exam    Assessment: 29 y.o. 34 routine annual exam  Plan: Problem List Items Addressed This Visit    None    Visit Diagnoses    Encounter for gynecological examination without abnormal finding    -  Primary   Breast screening       Encounter for surveillance of vaginal ring hormonal contraceptive device           2) STI screening  wasoffered and declined  2)  ASCCP guidelines and rational discussed.  Patient opts for every 3 years screening interval  3) Contraception - the patient is currently using  NuvaRing vaginal inserts.  She is happy with her current form of contraception and plans to continue  4) Routine healthcare maintenance including cholesterol, diabetes screening discussed managed by PCP  5) Return in about 1 year (around 08/06/2020) for annual.   08/08/2020, MD, [facility], [facility] 08/07/2019, 3:31 PM

## 2020-07-02 ENCOUNTER — Other Ambulatory Visit: Payer: Self-pay

## 2020-07-30 ENCOUNTER — Other Ambulatory Visit: Payer: Self-pay | Admitting: Obstetrics and Gynecology

## 2020-07-30 MED ORDER — ETONOGESTREL-ETHINYL ESTRADIOL 0.12-0.015 MG/24HR VA RING: VAGINAL_RING | VAGINAL | 0 refills | Status: DC

## 2020-11-20 ENCOUNTER — Other Ambulatory Visit: Payer: Self-pay

## 2020-11-20 MED ORDER — ETONOGESTREL-ETHINYL ESTRADIOL 0.12-0.015 MG/24HR VA RING: VAGINAL_RING | VAGINAL | 0 refills | Status: DC

## 2020-11-20 NOTE — Telephone Encounter (Signed)
Pt calling for refill of nuvaring; has appt on 12/27/20.  [phone]  Pt aware refill eRx'd.

## 2020-12-27 ENCOUNTER — Ambulatory Visit: Payer: Medicaid Other | Admitting: Obstetrics and Gynecology

## 2021-02-07 ENCOUNTER — Telehealth: Payer: Self-pay

## 2021-02-07 MED ORDER — ETONOGESTREL-ETHINYL ESTRADIOL 0.12-0.015 MG/24HR VA RING: VAGINAL_RING | VAGINAL | 0 refills | Status: DC

## 2021-02-07 NOTE — Telephone Encounter (Signed)
Pt calling for a refill of her nuvaring; appt 02/19/21.  [phone]  Called to verify pharm which is [facility].  Pt states she has been sick for a week now with nasal congestion, chest tightness, cough; is taking mucinex and tylenol and nothing is working; is trying to get to an UC today.

## 2021-02-19 ENCOUNTER — Ambulatory Visit (INDEPENDENT_AMBULATORY_CARE_PROVIDER_SITE_OTHER): Payer: Medicaid Other | Admitting: Licensed Practical Nurse

## 2021-02-19 ENCOUNTER — Other Ambulatory Visit: Payer: Self-pay

## 2021-02-19 ENCOUNTER — Encounter: Payer: Self-pay | Admitting: Licensed Practical Nurse

## 2021-02-19 ENCOUNTER — Other Ambulatory Visit (HOSPITAL_COMMUNITY)
Admission: RE | Admit: 2021-02-19 | Discharge: 2021-02-19 | Disposition: A | Discharge status: Home / Self Care | Payer: Medicaid Other | Source: Ambulatory Visit | Attending: Licensed Practical Nurse | Admitting: Licensed Practical Nurse

## 2021-02-19 VITALS — BP 122/70 | Ht 63.0 in | Wt 172.3 lb

## 2021-02-19 DIAGNOSIS — Z01419 Encounter for gynecological examination (general) (routine) without abnormal findings: Secondary | ICD-10-CM

## 2021-02-19 DIAGNOSIS — Z124 Encounter for screening for malignant neoplasm of cervix: Secondary | ICD-10-CM | POA: Insufficient documentation

## 2021-02-19 DIAGNOSIS — Z131 Encounter for screening for diabetes mellitus: Secondary | ICD-10-CM

## 2021-02-19 DIAGNOSIS — R6889 Other general symptoms and signs: Secondary | ICD-10-CM | POA: Diagnosis not present

## 2021-02-19 DIAGNOSIS — Z13220 Encounter for screening for lipoid disorders: Secondary | ICD-10-CM

## 2021-02-19 DIAGNOSIS — Z113 Encounter for screening for infections with a predominantly sexual mode of transmission: Secondary | ICD-10-CM

## 2021-02-20 ENCOUNTER — Encounter: Payer: Self-pay | Admitting: Licensed Practical Nurse

## 2021-02-20 LAB — HEMOGLOBIN A1C
Est. average glucose Bld gHb Est-mCnc: 111 mg/dL
Hgb A1c MFr Bld: 5.5 % (ref 4.8–5.6)

## 2021-02-20 LAB — CBC WITH DIFFERENTIAL/PLATELET
Basophils Absolute: 0 10*3/uL (ref 0.0–0.2)
Basos: 1 %
EOS (ABSOLUTE): 0.1 10*3/uL (ref 0.0–0.4)
Eos: 1 %
Hematocrit: 39.7 % (ref 34.0–46.6)
Hemoglobin: 13.1 g/dL (ref 11.1–15.9)
Immature Grans (Abs): 0 10*3/uL (ref 0.0–0.1)
Immature Granulocytes: 0 %
Lymphocytes Absolute: 2.8 10*3/uL (ref 0.7–3.1)
Lymphs: 37 %
MCH: 29.8 pg (ref 26.6–33.0)
MCHC: 33 g/dL (ref 31.5–35.7)
MCV: 90 fL (ref 79–97)
Monocytes Absolute: 0.6 10*3/uL (ref 0.1–0.9)
Monocytes: 8 %
Neutrophils Absolute: 4.1 10*3/uL (ref 1.4–7.0)
Neutrophils: 53 %
Platelets: 448 10*3/uL (ref 150–450)
RBC: 4.39 x10E6/uL (ref 3.77–5.28)
RDW: 12.2 % (ref 11.7–15.4)
WBC: 7.7 10*3/uL (ref 3.4–10.8)

## 2021-02-20 LAB — TSH+FREE T4
Free T4: 1.14 ng/dL (ref 0.82–1.77)
TSH: 0.696 u[IU]/mL (ref 0.450–4.500)

## 2021-02-20 NOTE — Progress Notes (Signed)
Gynecology Annual Exam   PCP: Patient, No Pcp Per (Inactive)  Chief Complaint:  Chief Complaint  Patient presents with   Gynecologic Exam    Annual Exam    History of Present Illness: Patient is a 31 y.o. [id number] presents for annual exam. Expressed concern because she has a family hx of heart disease, her father died of a heart attack at age 49, and his mother passed in her 34's from a heart attack as well.  She knows there is HTN and DM in her family as well.    LMP: No LMP recorded. (Menstrual status: Other). Does not cycle with the Nuva ring unless she is late switching out rings.   Intermenstrual Bleeding: no Postcoital Bleeding: no Dysmenorrhea: no  The patient is sexually active. She currently uses NuvaRing vaginal inserts for contraception. She denies dyspareunia.  The patient does perform self breast exams.  There is no notable family history of breast or ovarian cancer in her family.  The patient wears seatbelts: yes.   The patient has regular exercise:  is trying .   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  Admits to using MJ to calm down as well, MJ helps her feel more relaxed, has tried CBD but it gives her headaches.   The patient reports current symptoms of depression.   Lost her father in 2019,since then she has struggled with depression on and off, was prescribed something then that made her "feel numb" so she stopped taking the medication, over time things seemed to have gotten better. Recently she has cared for her brother who was in a significant car accident, caring for him during his struggles has brought her down. Also reports she has symptoms of anxiety-feeling shaky and her heart races when she gets nervous or overwhelmed-not clear on how often this happens, does not seem to be daily. Has many coping mechanism to deal with anxiety/stress, but admits she relies  on MJ and cigarettes often.  Desires to quit these. Offered Hydroxyzine today, pt declines as she does want to take anything,.   Pt expresses interest in improving her health as she knows her family history puts her at risk heart disease and diabetes, states she is able to lose weight, but then it comes back on-admits to emotional eating. She has recently changed her eating habits and is eating "better", she likes to go for walks for exercise. Desires to quit smoking has tried on occasion to quit. Does not drink much alcohol-maybe a few drinks on the weekends.    Review of Systems: Review of Systems  Constitutional:        Weight changes  HENT: Negative.    Respiratory:  Positive for cough and shortness of breath.        Had a cold 2 weeks ago, improving but has a lingering cough  Cardiovascular:        Palpitations when anxious  Gastrointestinal: Negative.   Genitourinary: Negative.   Musculoskeletal: Negative.   Neurological: Negative.   Endo/Heme/Allergies: Negative.   Psychiatric/Behavioral:  Positive for depression. The patient is nervous/anxious.    Past Medical History:  There are no problems to display for this patient.   Past Surgical History:  Past Surgical History:  Procedure Laterality Date   CHOLECYSTECTOMY, LAPAROSCOPIC  2017    Gynecologic History:  No LMP recorded. (Menstrual status: Other). Contraception: NuvaRing vaginal  inserts Last Pap: Results were: no abnormalities 10/15/2017   Obstetric History: [id number]  Family History:  Family History  Problem Relation Age of Onset   Heart attack Father    Heart attack Paternal Grandmother    Heart attack Paternal Grandfather     Social History:  Social History   Socioeconomic History   Marital status: Single    Spouse name: Not on file   Number of children: Not on file   Years of education: Not on file   Highest education level: Not on file  Occupational History   Not on file  Tobacco Use   Smoking  status: Never   Smokeless tobacco: Never  Vaping Use   Vaping Use: Never used  Substance and Sexual Activity   Alcohol use: Yes    Comment: occasionally    Drug use: Never   Sexual activity: Yes    Birth control/protection: Inserts    Comment: Nuvaring  Other Topics Concern   Not on file  Social History Narrative   Not on file   Social Determinants of Health   Financial Resource Strain: Not on file  Food Insecurity: Not on file  Transportation Needs: Not on file  Physical Activity: Not on file  Stress: Not on file  Social Connections: Not on file  Intimate Partner Violence: Not on file    Allergies:  No Known Allergies  Medications: Prior to Admission medications   Medication Sig Start Date End Date Taking? Authorizing Provider  etonogestrel-ethinyl estradiol (ELURYNG) 0.12-0.015 MG/24HR vaginal ring INSERT VAGINALLY AND LEAVE IN PLACE FOR 3 CONSECUTIVE WEEKS, THEN REMOVE FOR 1 WEEK 02/07/21  Yes Tyson Masin, Courtney Heys, CNM  ibuprofen (ADVIL) 800 MG tablet Take 1 tablet (800 mg total) by mouth every 8 (eight) hours as needed. 08/09/18  Yes Don Perking, [place], MD  promethazine-dextromethorphan (PROMETHAZINE-DM) 6.25-15 MG/5ML syrup Take 5 mLs by mouth every 4 (four) hours as needed. 02/07/21  Yes [provider]    Physical Exam Vitals: Blood pressure 122/70, height 5\' 3"  (1.6 m), weight 172 lb 4.8 oz (78.2 kg).  General: NAD HEENT: normocephalic, anicteric Thyroid: no enlargement, no palpable nodules Pulmonary: No increased work of breathing, CTAB Cardiovascular: RRR, distal pulses 2+ Breast: Breast symmetrical, no tenderness, no palpable nodules or masses, no skin or nipple retraction present, no nipple discharge.  No axillary or supraclavicular lymphadenopathy. Abdomen: NABS, soft, non-tender, non-distended.  Umbilicus without lesions.  No hepatomegaly, splenomegaly or masses palpable. No evidence of hernia  Genitourinary:  External: Normal external female  genitalia.  Normal urethral meatus, normal Bartholin's and Skene's glands.    Vagina: Normal vaginal mucosa, no evidence of prolapse.  Nuva Ring present   Cervix: Grossly normal in appearance, no bleeding  Uterus: Non-enlarged, mobile, normal contour.  No CMT  Adnexa: ovaries non-enlarged, no adnexal masses  Rectal: deferred  Lymphatic: no evidence of inguinal lymphadenopathy Extremities: no edema, erythema, or tenderness Neurologic: Grossly intact Psychiatric: mood appropriate, affect full   Assessment: 31 y.o. [id number] routine annual exam Cervical cancer screening Screening for DM Screening for sexually transmitted infection  Plan: Problem List Items Addressed This Visit   None Visit Diagnoses     Well woman exam    -  Primary   Relevant Orders   TSH + free T4 (Completed)   CBC w/Diff/Platelet (Completed)   Hemoglobin A1c (Completed)   Cytology - PAP   Lipid panel   HEP, RPR, HIV Panel   Hepatitis C antibody   Change in weight  Relevant Orders   TSH + free T4 (Completed)   Diabetes mellitus screening       Relevant Orders   Hemoglobin A1c (Completed)   Cervical cancer screening       Relevant Orders   Cytology - PAP   Screening examination for venereal disease       Relevant Orders   Hepatitis C antibody   Screening for lipid disorders       Relevant Orders   Lipid panel   HEP, RPR, HIV Panel       2) STI screening  wasoffered and accepted  2)  ASCCP guidelines and rational discussed.  Patient opts for every 3 years screening interval  3) Contraception - the patient is currently using  NuvaRing vaginal inserts.  She is happy with her current form of contraception and plans to continue  4) Routine healthcare maintenance including cholesterol, diabetes screening discussed Ordered today.  Will return for Lipids and STI screening.   5) RTC to if you desire treatment for anxiety   Carie Caddy, CNM  [facility], [facility] 02/20/2021,  11:05 AM

## 2021-02-21 LAB — CYTOLOGY - PAP
Chlamydia: NEGATIVE
Comment: NEGATIVE
Comment: NEGATIVE
Comment: NEGATIVE
Comment: NORMAL
Diagnosis: NEGATIVE
High risk HPV: NEGATIVE
Neisseria Gonorrhea: NEGATIVE
Trichomonas: NEGATIVE

## 2021-02-24 ENCOUNTER — Encounter: Payer: Self-pay | Admitting: Licensed Practical Nurse

## 2021-04-07 ENCOUNTER — Other Ambulatory Visit: Payer: Self-pay

## 2021-04-07 DIAGNOSIS — R112 Nausea with vomiting, unspecified: Secondary | ICD-10-CM | POA: Diagnosis not present

## 2021-04-07 DIAGNOSIS — R197 Diarrhea, unspecified: Secondary | ICD-10-CM | POA: Diagnosis not present

## 2021-04-07 DIAGNOSIS — Z20822 Contact with and (suspected) exposure to covid-19: Secondary | ICD-10-CM | POA: Insufficient documentation

## 2021-04-07 DIAGNOSIS — R1084 Generalized abdominal pain: Secondary | ICD-10-CM | POA: Insufficient documentation

## 2021-04-07 DIAGNOSIS — R109 Unspecified abdominal pain: Secondary | ICD-10-CM | POA: Diagnosis present

## 2021-04-07 DIAGNOSIS — E876 Hypokalemia: Secondary | ICD-10-CM | POA: Insufficient documentation

## 2021-04-07 LAB — CBC
HCT: 42 % (ref 36.0–46.0)
Hemoglobin: 13.9 g/dL (ref 12.0–15.0)
MCH: 29.4 pg (ref 26.0–34.0)
MCHC: 33.1 g/dL (ref 30.0–36.0)
MCV: 88.8 fL (ref 80.0–100.0)
Platelets: 412 10*3/uL — ABNORMAL HIGH (ref 150–400)
RBC: 4.73 MIL/uL (ref 3.87–5.11)
RDW: 13.1 % (ref 11.5–15.5)
WBC: 5.9 10*3/uL (ref 4.0–10.5)
nRBC: 0 % (ref 0.0–0.2)

## 2021-04-07 LAB — URINALYSIS, ROUTINE W REFLEX MICROSCOPIC
Bacteria, UA: NONE SEEN
Bilirubin Urine: NEGATIVE
Glucose, UA: NEGATIVE mg/dL
Ketones, ur: NEGATIVE mg/dL
Leukocytes,Ua: NEGATIVE
Nitrite: NEGATIVE
Protein, ur: 30 mg/dL — AB
Specific Gravity, Urine: 1.031 — ABNORMAL HIGH (ref 1.005–1.030)
pH: 5 (ref 5.0–8.0)

## 2021-04-07 LAB — COMPREHENSIVE METABOLIC PANEL
ALT: 52 U/L — ABNORMAL HIGH (ref 0–44)
AST: 49 U/L — ABNORMAL HIGH (ref 15–41)
Albumin: 3.9 g/dL (ref 3.5–5.0)
Alkaline Phosphatase: 60 U/L (ref 38–126)
Anion gap: 9 (ref 5–15)
BUN: 25 mg/dL — ABNORMAL HIGH (ref 6–20)
CO2: 25 mmol/L (ref 22–32)
Calcium: 9 mg/dL (ref 8.9–10.3)
Chloride: 103 mmol/L (ref 98–111)
Creatinine, Ser: 0.7 mg/dL (ref 0.44–1.00)
GFR, Estimated: >60 mL/min (ref >60)
Glucose, Bld: 108 mg/dL — ABNORMAL HIGH (ref 70–99)
Potassium: 3.2 mmol/L — ABNORMAL LOW (ref 3.5–5.1)
Sodium: 137 mmol/L (ref 135–145)
Total Bilirubin: 0.2 mg/dL — ABNORMAL LOW (ref 0.3–1.2)
Total Protein: 7.4 g/dL (ref 6.5–8.1)

## 2021-04-07 LAB — RESP PANEL BY RT-PCR (FLU A&B, COVID) ARPGX2
Influenza A by PCR: NEGATIVE
Influenza B by PCR: NEGATIVE
SARS Coronavirus 2 by RT PCR: NEGATIVE

## 2021-04-07 LAB — POC URINE PREG, ED: Preg Test, Ur: NEGATIVE

## 2021-04-07 LAB — LIPASE, BLOOD: Lipase: 27 U/L (ref 11–51)

## 2021-04-07 NOTE — ED Triage Notes (Signed)
Pt presents via POV c/o vomiting and diarrhea since Saturday. Reports generalized body aches. Home Covid test negative. Reports some abd cramping.  ?

## 2021-04-08 ENCOUNTER — Emergency Department
Admission: EM | Admit: 2021-04-08 | Discharge: 2021-04-08 | Disposition: A | Discharge status: Home / Self Care | Payer: Medicaid Other | Attending: Emergency Medicine | Admitting: Emergency Medicine

## 2021-04-08 ENCOUNTER — Emergency Department: Payer: Medicaid Other

## 2021-04-08 DIAGNOSIS — E876 Hypokalemia: Secondary | ICD-10-CM

## 2021-04-08 DIAGNOSIS — R112 Nausea with vomiting, unspecified: Secondary | ICD-10-CM

## 2021-04-08 DIAGNOSIS — R109 Unspecified abdominal pain: Secondary | ICD-10-CM

## 2021-04-08 MED ORDER — SODIUM CHLORIDE 0.9 % IV BOLUS
1000.0000 mL | Freq: Once | INTRAVENOUS | Status: AC
  Administered 2021-04-08: 1000 mL via INTRAVENOUS

## 2021-04-08 MED ORDER — ONDANSETRON HCL 4 MG/2ML IJ SOLN
4.0000 mg | Freq: Once | INTRAMUSCULAR | Status: AC
  Administered 2021-04-08: 4 mg via INTRAVENOUS
  Filled 2021-04-08: qty 2

## 2021-04-08 MED ORDER — IOHEXOL 300 MG/ML  SOLN
100.0000 mL | Freq: Once | INTRAMUSCULAR | Status: AC | PRN
  Administered 2021-04-08: 100 mL via INTRAVENOUS

## 2021-04-08 MED ORDER — MORPHINE SULFATE (PF) 4 MG/ML IV SOLN
4.0000 mg | Freq: Once | INTRAVENOUS | Status: AC
  Administered 2021-04-08: 4 mg via INTRAVENOUS
  Filled 2021-04-08: qty 1

## 2021-04-08 MED ORDER — POTASSIUM CHLORIDE 10 MEQ/100ML IV SOLN
10.0000 meq | Freq: Once | INTRAVENOUS | Status: AC
  Administered 2021-04-08: 10 meq via INTRAVENOUS
  Filled 2021-04-08: qty 100

## 2021-04-08 MED ORDER — ONDANSETRON 4 MG PO TBDP: 4.0000 mg | ORAL_TABLET | Freq: Three times a day (TID) | ORAL | 0 refills | Status: DC | PRN

## 2021-04-08 MED ORDER — CIPROFLOXACIN HCL 500 MG PO TABS: 500.0000 mg | ORAL_TABLET | Freq: Two times a day (BID) | ORAL | 0 refills | Status: DC

## 2021-04-08 NOTE — Discharge Instructions (Addendum)
1.  Take antibiotic as prescribed (Cipro 500 mg twice daily x3 days). ?2.  You may take Zofran as needed for nausea/vomiting. ?3.  Clear liquids x12 hours, then [employer] x3 days, then slowly advance diet as tolerated. ?4.  You may return stool specimen to outpatient laboratory at your convenience. ?5.  Return to the ER for worsening symptoms, persistent vomiting, difficulty breathing or other concerns. ?

## 2021-04-08 NOTE — ED Notes (Signed)
Specimen cup and outpatient lab req form provided to pt for stool sample ?

## 2021-04-08 NOTE — ED Notes (Signed)
Pt unable to provide stool sample. Pt has been asked by this nurse a couple of times and not able to provide one at this time ?

## 2021-04-08 NOTE — ED Provider Notes (Signed)
? ?[facility] ?Provider Note ? ? ? Event Date/Time  ? First MD Initiated Contact with Patient 04/08/21 0138   ?  (approximate) ? ? ?History  ? ?Emesis ? ? ?HPI ? ?Catherine [name] is a 31 y.o. female who presents to the ED from home with a chief complaint of abdominal pain, nausea/vomiting/diarrhea x3 to 4 days.  Endorses generalized body aches.  Home COVID test is negative.  Her son had similar symptoms but his was a 24-hour bug.  Denies fever, chills, cough, chest pain, shortness of breath, dysuria.  Denies recent travel or antibiotic use. ?  ? ? ?Past Medical History  ? ?Past Medical History:  ?Diagnosis Date  ? HSV-2 infection 2014  ? ? ? ?Active Problem List  ?There are no problems to display for this patient. ? ? ? ?Past Surgical History  ? ?Past Surgical History:  ?Procedure Laterality Date  ? CHOLECYSTECTOMY, LAPAROSCOPIC  2017  ? ? ? ?Home Medications  ? ?Prior to Admission medications   ?Medication Sig Start Date End Date Taking? Authorizing Provider  ?etonogestrel-ethinyl estradiol (ELURYNG) 0.12-0.015 MG/24HR vaginal ring INSERT VAGINALLY AND LEAVE IN PLACE FOR 3 CONSECUTIVE WEEKS, THEN REMOVE FOR 1 WEEK 02/07/21   Dominic, Nunzio Cobbs, CNM  ?ibuprofen (ADVIL) 800 MG tablet Take 1 tablet (800 mg total) by mouth every 8 (eight) hours as needed. 08/09/18   Rudene Re, MD  ?promethazine-dextromethorphan (PROMETHAZINE-DM) 6.25-15 MG/5ML syrup Take 5 mLs by mouth every 4 (four) hours as needed. 02/07/21   [provider]  ? ? ? ?Allergies  ?Patient has no known allergies. ? ? ?Family History  ? ?Family History  ?Problem Relation Age of Onset  ? Heart attack Father   ? Heart attack Paternal Grandmother   ? Heart attack Paternal Grandfather   ? ? ? ?Physical Exam  ?Triage Vital Signs: ?ED Triage Vitals  ?Enc Vitals Group  ?   BP   ?   Pulse   ?   Resp   ?   Temp   ?   Temp src   ?   SpO2   ?   Weight   ?   Height   ?   Head Circumference   ?   Peak Flow   ?   Pain Score    ?   Pain Loc   ?   Pain Edu?   ?   Excl. in [place]?   ? ? ?Updated Vital Signs: ?BP 107/64   Pulse 89   Resp 18   SpO2 97%  ? ? ?General: Awake, no distress.  Mildly dry mucous membranes. ?CV:  RRR.  Good peripheral perfusion.  ?Resp:  Normal effort.  CTA B. ?Abd:  Mild diffuse generalized tenderness to palpation without rebound or guarding.  No distention.  ?Other:  No vesicles. ? ? ?ED Results / Procedures / Treatments  ?Labs ?(all labs ordered are listed, but only abnormal results are displayed) ?Labs Reviewed  ?COMPREHENSIVE METABOLIC PANEL - Abnormal; Notable for the following components:  ?    Result Value  ? Potassium 3.2 (*)   ? Glucose, Bld 108 (*)   ? BUN 25 (*)   ? AST 49 (*)   ? ALT 52 (*)   ? Total Bilirubin 0.2 (*)   ? All other components within normal limits  ?CBC - Abnormal; Notable for the following components:  ? Platelets 412 (*)   ? All other components within normal limits  ?URINALYSIS, ROUTINE  W REFLEX MICROSCOPIC - Abnormal; Notable for the following components:  ? Color, Urine YELLOW (*)   ? APPearance HAZY (*)   ? Specific Gravity, Urine 1.031 (*)   ? Hgb urine dipstick MODERATE (*)   ? Protein, ur 30 (*)   ? All other components within normal limits  ?RESP PANEL BY RT-PCR (FLU A&B, COVID) ARPGX2  ?C DIFFICILE QUICK SCREEN W PCR REFLEX    ?GASTROINTESTINAL PANEL BY PCR, STOOL (REPLACES STOOL CULTURE)  ?LIPASE, BLOOD  ?POC URINE PREG, ED  ? ? ? ?EKG ? ?None ? ? ?RADIOLOGY ?I have independently visualized and reviewed patient's CT abdomen pelvis as well as noted the radiology interpretation: ? ?CT abdomen pelvis: No acute abnormality; simple renal cyst ? ?Official radiology report(s): ?CT Abdomen Pelvis W Contrast ? ?Result Date: 04/08/2021 ?CLINICAL DATA:  Vomiting and diarrhea for several days, initial encounter EXAM: CT ABDOMEN AND PELVIS WITH CONTRAST TECHNIQUE: Multidetector CT imaging of the abdomen and pelvis was performed using the standard protocol following bolus administration of  intravenous contrast. RADIATION DOSE REDUCTION: This exam was performed according to the departmental dose-optimization program which includes automated exposure control, adjustment of the mA and/or kV according to patient size and/or use of iterative reconstruction technique. CONTRAST:  147m OMNIPAQUE IOHEXOL 300 MG/ML  SOLN COMPARISON:  None. FINDINGS: Lower chest: No acute abnormality. Hepatobiliary: No focal liver abnormality is seen. Status post cholecystectomy. No biliary dilatation. Pancreas: Unremarkable. No pancreatic ductal dilatation or surrounding inflammatory changes. Spleen: Normal in size without focal abnormality. Adrenals/Urinary Tract: Adrenal glands are within normal limits. Kidneys are well visualized bilaterally. 15 mm cyst is noted in the midportion of right kidney. No renal calculi or obstructive changes are seen. Bladder is partially distended. Stomach/Bowel: The appendix is within normal limits. No obstructive or inflammatory changes of colon are seen. Small bowel and stomach are within normal limits. Vascular/Lymphatic: No significant vascular findings are present. No enlarged abdominal or pelvic lymph nodes. Reproductive: Uterus and bilateral adnexa are unremarkable. Vaginal ring is noted consistent with contraceptive device Other: No abdominal wall hernia or abnormality. No abdominopelvic ascites. Musculoskeletal: No acute or significant osseous findings. IMPRESSION: No acute abnormality noted. 15 mm right renal cyst which appears simple in nature. No follow-up exam is recommended. Electronically Signed   By: MInez CatalinaM.D.   On: 04/08/2021 03:07   ? ? ?PROCEDURES: ? ?Critical Care performed: No ? ?Procedures ? ? ?MEDICATIONS ORDERED IN ED: ?Medications  ?sodium chloride 0.9 % bolus 1,000 mL (1,000 mLs Intravenous New Bag/Given 04/08/21 0229)  ?ondansetron (ZOFRAN) injection 4 mg (4 mg Intravenous Given 04/08/21 0230)  ?morphine (PF) 4 MG/ML injection 4 mg (4 mg Intravenous Given  04/08/21 0230)  ?potassium chloride 10 mEq in 100 mL IVPB (0 mEq Intravenous Stopped 04/08/21 0434)  ?potassium chloride 10 mEq in 100 mL IVPB (0 mEq Intravenous Stopped 04/08/21 0333)  ?iohexol (OMNIPAQUE) 300 MG/ML solution 100 mL (100 mLs Intravenous Contrast Given 04/08/21 0253)  ? ? ? ?IMPRESSION / MDM / ASSESSMENT AND PLAN / ED COURSE  ?I reviewed the triage vital signs and the nursing notes. ?             ?               ?31year old female presenting with abdominal pain, nausea, vomiting and diarrhea. Differential diagnosis includes, but is not limited to, ovarian cyst, ovarian torsion, acute appendicitis, diverticulitis, urinary tract infection/pyelonephritis, endometriosis, bowel obstruction, colitis, renal colic, gastroenteritis, hernia, fibroids, endometriosis, pregnancy  related pain including ectopic pregnancy, etc. I have personally reviewed patient's records and see a recent office visit on 02/19/2021 with her gynecologist for routine physical. ? ?The patient is on the cardiac monitor to evaluate for evidence of arrhythmia and/or significant heart rate changes. ? ?Laboratory results demonstrate normal WBC 5.9, mild hypokalemia potassium 3.2, negative urine for infection, negative respiratory panel.  Will initiate IV fluid resuscitation, morphine and Zofran for pain and nausea, replete potassium via IV.  Obtain stool studies if patient is able to produce specimen.  Obtain CT abdomen/pelvis to evaluate etiology of patient's symptoms.  Will reassess. ? ?Clinical Course as of 04/08/21 0449  ?Tue Apr 08, 2021  ?0448 Patient tolerated liquids without emesis.  Updated her of unremarkable CT scan results.  Oral temperature 97.7 ?F.  Patient has not produced stool in the ED.  Will send home with stool collection kit.  Discussed risk/benefits of short course of Cipro; patient wishes to proceed.  Will discharge home with prescription for Cipro and as needed Zofran and patient will follow up closely with her PCP.   Strict return precautions given.  Patient verbalizes understanding and agrees with plan of care. [JS]  ?  ?Clinical Course User Index ?[JS] Paulette Blanch, MD  ? ? ? ?FINAL CLINICAL IMPRESSION(S) / ED DIAGNOSES  ? ?Fin

## 2021-04-09 ENCOUNTER — Emergency Department
Admission: EM | Admit: 2021-04-09 | Discharge: 2021-04-09 | Disposition: A | Discharge status: Home / Self Care | Payer: Medicaid Other | Attending: Emergency Medicine | Admitting: Emergency Medicine

## 2021-04-09 ENCOUNTER — Other Ambulatory Visit: Payer: Self-pay

## 2021-04-09 ENCOUNTER — Telehealth: Payer: Self-pay

## 2021-04-09 DIAGNOSIS — D649 Anemia, unspecified: Secondary | ICD-10-CM | POA: Diagnosis not present

## 2021-04-09 DIAGNOSIS — R1084 Generalized abdominal pain: Secondary | ICD-10-CM | POA: Diagnosis present

## 2021-04-09 DIAGNOSIS — K529 Noninfective gastroenteritis and colitis, unspecified: Secondary | ICD-10-CM | POA: Insufficient documentation

## 2021-04-09 LAB — GASTROINTESTINAL PANEL BY PCR, STOOL (REPLACES STOOL CULTURE)

## 2021-04-09 LAB — COMPREHENSIVE METABOLIC PANEL
ALT: 48 U/L — ABNORMAL HIGH (ref 0–44)
AST: 32 U/L (ref 15–41)
Albumin: 3.8 g/dL (ref 3.5–5.0)
Alkaline Phosphatase: 58 U/L (ref 38–126)
Anion gap: 7 (ref 5–15)
BUN: 23 mg/dL — ABNORMAL HIGH (ref 6–20)
CO2: 24 mmol/L (ref 22–32)
Calcium: 8.7 mg/dL — ABNORMAL LOW (ref 8.9–10.3)
Chloride: 108 mmol/L (ref 98–111)
Creatinine, Ser: 0.73 mg/dL (ref 0.44–1.00)
GFR, Estimated: >60 mL/min (ref >60)
Glucose, Bld: 88 mg/dL (ref 70–99)
Potassium: 3.7 mmol/L (ref 3.5–5.1)
Sodium: 139 mmol/L (ref 135–145)
Total Bilirubin: 0.4 mg/dL (ref 0.3–1.2)
Total Protein: 7.1 g/dL (ref 6.5–8.1)

## 2021-04-09 LAB — URINALYSIS, ROUTINE W REFLEX MICROSCOPIC
Bacteria, UA: NONE SEEN
Bilirubin Urine: NEGATIVE
Glucose, UA: NEGATIVE mg/dL
Ketones, ur: 5 mg/dL — AB
Leukocytes,Ua: NEGATIVE
Nitrite: NEGATIVE
Protein, ur: 30 mg/dL — AB
Specific Gravity, Urine: 1.028 (ref 1.005–1.030)
pH: 5 (ref 5.0–8.0)

## 2021-04-09 LAB — CBC
HCT: 44.4 % (ref 36.0–46.0)
Hemoglobin: 14.3 g/dL (ref 12.0–15.0)
MCH: 29.3 pg (ref 26.0–34.0)
MCHC: 32.2 g/dL (ref 30.0–36.0)
MCV: 91 fL (ref 80.0–100.0)
Platelets: 417 10*3/uL — ABNORMAL HIGH (ref 150–400)
RBC: 4.88 MIL/uL (ref 3.87–5.11)
RDW: 13.2 % (ref 11.5–15.5)
WBC: 8.8 10*3/uL (ref 4.0–10.5)
nRBC: 0 % (ref 0.0–0.2)

## 2021-04-09 LAB — C DIFFICILE QUICK SCREEN W PCR REFLEX
C Diff antigen: NEGATIVE
C Diff interpretation: NOT DETECTED
C Diff toxin: NEGATIVE

## 2021-04-09 LAB — LIPASE, BLOOD: Lipase: 28 U/L (ref 11–51)

## 2021-04-09 LAB — POC URINE PREG, ED: Preg Test, Ur: NEGATIVE

## 2021-04-09 MED ORDER — DICYCLOMINE HCL 10 MG PO CAPS: 10.0000 mg | ORAL_CAPSULE | Freq: Three times a day (TID) | ORAL | 0 refills | Status: DC

## 2021-04-09 MED ORDER — LACTATED RINGERS IV BOLUS
1000.0000 mL | Freq: Once | INTRAVENOUS | Status: AC
  Administered 2021-04-09: 1000 mL via INTRAVENOUS

## 2021-04-09 MED ORDER — PANTOPRAZOLE SODIUM 40 MG IV SOLR
40.0000 mg | Freq: Once | INTRAVENOUS | Status: AC
  Administered 2021-04-09: 40 mg via INTRAVENOUS
  Filled 2021-04-09: qty 10

## 2021-04-09 MED ORDER — ONDANSETRON HCL 4 MG/2ML IJ SOLN
4.0000 mg | Freq: Once | INTRAMUSCULAR | Status: AC
  Administered 2021-04-09: 4 mg via INTRAVENOUS
  Filled 2021-04-09: qty 2

## 2021-04-09 MED ORDER — DICYCLOMINE HCL 10 MG PO CAPS
10.0000 mg | ORAL_CAPSULE | Freq: Once | ORAL | Status: AC
Start: 2021-04-09 — End: 2021-04-09
  Administered 2021-04-09: 10 mg via ORAL
  Filled 2021-04-09: qty 1

## 2021-04-09 NOTE — Telephone Encounter (Signed)
Pt calling; went to ER for nausea/vomiting/diarrhea night before last; was give rx; rx is not helping as she is still having the diarrhea/nausea/vomiting; is supposed to take a stool sample to the lab this morning; should she go back to the ER?  [phone] Pt states she is not getting any better; adv we are not a primary care provider; to return to ER. ?

## 2021-04-09 NOTE — ED Provider Notes (Signed)
? ?[facility] ?Provider Note ? ? ? Event Date/Time  ? First MD Initiated Contact with Patient 04/09/21 1107   ?  (approximate) ? ? ?History  ? ?Abdominal Pain ? ? ?HPI ? ?Catherine [name] is a 31 y.o. female without significant past medical history who presents for evaluation of some ongoing nonbloody nonbilious nausea, vomiting, diarrhea and crampy generalized abdominal pain.  Patient was seen emergency room yesterday where she had blood work and a CT and urine studies obtained.  These were largely reassuring but blood work showed mild hypokalemia and CT showing right renal cyst but no other acute abdominal process.  Patient states he has been trying to take antibiotics were prescribed Cipro as well as Zofran which she last took around 2 in the morning but woke up around 6 vomiting again today.  She did try to give some trickle little soup last night but thinks this upset her stomach and caused her to vomit this morning.  She states today is day 5 of her symptoms.  She has developed a sore throat and some burning when she is vomiting.  No new fevers, cough, headache, shortness of breath, back pain, urinary symptoms any blood in the stool rash or extremity pain.  No other acute concerns at this time. ? ?  ?Past Medical History:  ?Diagnosis Date  ? HSV-2 infection 2014  ? ? ? ?Physical Exam  ?Triage Vital Signs: ?ED Triage Vitals  ?Enc Vitals Group  ?   BP 04/09/21 0926 120/71  ?   Pulse Rate 04/09/21 0926 91  ?   Resp 04/09/21 0926 18  ?   Temp 04/09/21 0926 98.3 ?F (36.8 ?C)  ?   Temp Source 04/09/21 0926 Oral  ?   SpO2 04/09/21 0926 100 %  ?   Weight --   ?   Height --   ?   Head Circumference --   ?   Peak Flow --   ?   Pain Score 04/09/21 0925 5  ?   Pain Loc --   ?   Pain Edu? --   ?   Excl. in [place]? --   ? ? ?Most recent vital signs: ?Vitals:  ? 04/09/21 0926 04/09/21 1133  ?BP: 120/71 115/75  ?Pulse: 91 61  ?Resp: 18 16  ?Temp: 98.3 ?F (36.8 ?C)   ?SpO2: 100% 98%  ? ? ?General: Awake,  no distress.  ?CV:  Slightly prolonged capillary refill in the digits.  2+ radial pulse. ?Resp:  Normal effort.  ?Abd:  No distention.  Soft throughout. ?Other:  Dry mucous membranes. ? ? ?ED Results / Procedures / Treatments  ?Labs ?(all labs ordered are listed, but only abnormal results are displayed) ?Labs Reviewed  ?COMPREHENSIVE METABOLIC PANEL - Abnormal; Notable for the following components:  ?    Result Value  ? BUN 23 (*)   ? Calcium 8.7 (*)   ? ALT 48 (*)   ? All other components within normal limits  ?CBC - Abnormal; Notable for the following components:  ? Platelets 417 (*)   ? All other components within normal limits  ?URINALYSIS, ROUTINE W REFLEX MICROSCOPIC - Abnormal; Notable for the following components:  ? Color, Urine YELLOW (*)   ? APPearance HAZY (*)   ? Hgb urine dipstick MODERATE (*)   ? Ketones, ur 5 (*)   ? Protein, ur 30 (*)   ? All other components within normal limits  ?C DIFFICILE QUICK SCREEN W  PCR REFLEX    ?GASTROINTESTINAL PANEL BY PCR, STOOL (REPLACES STOOL CULTURE)  ?LIPASE, BLOOD  ?POC URINE PREG, ED  ? ? ? ?EKG ? ? ? ?RADIOLOGY ? ?I reviewed CT abdomen pelvis obtained yesterday that showed no evidence of diverticulitis, appendicitis, kidney stone, perinephric stranding, cholecystitis, pancreatitis or other acute abdominal or pelvic process.  My severe radiology interpretation and agree with the findings of a right renal cyst that appears simple. ? ? ?PROCEDURES: ? ?Critical Care performed: No ? ?Procedures ? ? ? ?MEDICATIONS ORDERED IN ED: ?Medications  ?lactated ringers bolus 1,000 mL (1,000 mLs Intravenous New Bag/Given 04/09/21 1200)  ?ondansetron [facility]) injection 4 mg (4 mg Intravenous Given 04/09/21 1201)  ?dicyclomine (BENTYL) capsule 10 mg (10 mg Oral Given 04/09/21 1158)  ?pantoprazole (PROTONIX) injection 40 mg (40 mg Intravenous Given 04/09/21 1202)  ? ? ? ?IMPRESSION / MDM / ASSESSMENT AND PLAN / ED COURSE  ?I reviewed the triage vital signs and the nursing notes. ?              ?               ? ?Differential diagnosis includes, but is not limited to dehydration and ongoing GI symptoms related to an acute infectious gastroenteritis possibly associated electrolyte derangements, and ? ?I reviewed CT abdomen pelvis obtained yesterday that showed no evidence of diverticulitis, appendicitis, kidney stone, perinephric stranding, cholecystitis, pancreatitis or other acute abdominal or pelvic process.  My severe radiology interpretation and agree with the findings of a right renal cyst that appears simple. ? ?Given reassuring CT obtained yesterday without any change in symptoms and benign abdominal exam today I have very low suspicion for interim development of appendicitis, diverticulitis, abscess, SBO, kidney stone or other immediately life-threatening intra-abdominal process. ? ?I also reviewed labs obtained during the evening of 27th including a negative COVID influenza test as well as a negative pregnancy test and UA that was not suggestive of cystitis. ? ?CBC today shows no leukocytosis, acute anemia and platelets of 417.  CMP today shows no significant electrolyte or metabolic derangements and resolution of previously noted hypokalemia.  Lipase today is not consistent with acute pancreatitis. ? ?Pregnancy test is negative and UA is not suggestive of cystitis.  She did screen is negative.  GI pathogen panel sent. ? ?Patient given some IV Zofran and fluids and subsequently was able to tolerate some oral Bentyl and lemon soda.  I suspect ongoing symptoms related to acute infectious gastroenteritis.  Given she is now able to orally hydrate I think she is appropriate for discharge with outpatient follow-up.  Discussed using her Zofran as needed every 6 hours that she can continue to orally hydrate and I will write a short course of Bentyl as it seems to have helped her abdominal cramps.  Emphasized importance of returning for any new or worsening of symptoms or recurrence of inability  tolerate p.o.  Advised she may complete her course of previously prescribed antibiotics.  Discharged in stable condition.  Strict return precautions advised and discussed.  Stool studies sent which she can follow-up online. ?  ? ? ?FINAL CLINICAL IMPRESSION(S) / ED DIAGNOSES  ? ?Final diagnoses:  ?Gastroenteritis  ? ? ? ?Rx / DC Orders  ? ?ED Discharge Orders   ? ?      Ordered  ?  dicyclomine (BENTYL) 10 MG capsule  3 times daily before meals & bedtime       ? 04/09/21 1243  ? ?  ?  ? ?  ? ? ? ?  Note:  This document was prepared using Dragon voice recognition software and may include unintentional dictation errors. ?  ?Lucrezia Starch, MD ?04/09/21 1246 ? ?

## 2021-04-09 NOTE — ED Triage Notes (Signed)
Pt comes with c/o abdominal pain, vomiting and diarrhea. Pt state she was just seen here recently for same. Pt also states meds have helped a little. ?

## 2021-04-09 NOTE — Discharge Instructions (Addendum)
Please take your nausea medicine as needed every 6 hours so that he can stay adequately hydrated.  I think it is reasonable to complete your course of antibiotics.  Please return for any new or worsening of your symptoms. ?

## 2021-05-06 ENCOUNTER — Other Ambulatory Visit: Payer: Self-pay | Admitting: Licensed Practical Nurse

## 2021-06-23 ENCOUNTER — Other Ambulatory Visit: Payer: Self-pay | Admitting: Licensed Practical Nurse

## 2021-06-24 ENCOUNTER — Other Ambulatory Visit: Payer: Self-pay | Admitting: Licensed Practical Nurse

## 2021-06-24 NOTE — Progress Notes (Signed)
Vaginal ring script reordered with 12 refills Carie Caddy, CNM  Domingo Pulse, [facility]  06/24/21  11:52 AM

## 2021-07-02 ENCOUNTER — Ambulatory Visit (INDEPENDENT_AMBULATORY_CARE_PROVIDER_SITE_OTHER): Payer: Medicaid Other

## 2021-07-02 ENCOUNTER — Encounter: Payer: Self-pay | Admitting: Licensed Practical Nurse

## 2021-07-02 DIAGNOSIS — R319 Hematuria, unspecified: Secondary | ICD-10-CM

## 2021-07-02 LAB — POCT URINALYSIS DIPSTICK
Bilirubin, UA: NEGATIVE
Glucose, UA: NEGATIVE
Ketones, UA: NEGATIVE
Leukocytes, UA: NEGATIVE
Nitrite, UA: NEGATIVE
Protein, UA: NEGATIVE
Spec Grav, UA: 1.02 (ref 1.010–1.025)
Urobilinogen, UA: 0.2 E.U./dL
pH, UA: 5 (ref 5.0–8.0)

## 2021-07-04 LAB — URINE CULTURE

## 2021-07-06 ENCOUNTER — Encounter: Payer: Self-pay | Admitting: Licensed Practical Nurse

## 2021-07-07 ENCOUNTER — Emergency Department: Payer: Medicaid Other

## 2021-07-07 ENCOUNTER — Emergency Department
Admission: EM | Admit: 2021-07-07 | Discharge: 2021-07-07 | Disposition: A | Discharge status: Home / Self Care | Payer: Medicaid Other | Attending: Emergency Medicine | Admitting: Emergency Medicine

## 2021-07-07 ENCOUNTER — Other Ambulatory Visit: Payer: Self-pay

## 2021-07-07 ENCOUNTER — Encounter: Payer: Self-pay | Admitting: Emergency Medicine

## 2021-07-07 DIAGNOSIS — M545 Low back pain, unspecified: Secondary | ICD-10-CM | POA: Insufficient documentation

## 2021-07-07 DIAGNOSIS — R319 Hematuria, unspecified: Secondary | ICD-10-CM | POA: Insufficient documentation

## 2021-07-07 LAB — URINALYSIS, ROUTINE W REFLEX MICROSCOPIC
Bilirubin Urine: NEGATIVE
Glucose, UA: NEGATIVE mg/dL
Ketones, ur: NEGATIVE mg/dL
Leukocytes,Ua: NEGATIVE
Nitrite: NEGATIVE
Protein, ur: NEGATIVE mg/dL
Specific Gravity, Urine: 1.028 (ref 1.005–1.030)
pH: 5 (ref 5.0–8.0)

## 2021-07-07 LAB — PREGNANCY, URINE: Preg Test, Ur: NEGATIVE

## 2021-07-07 MED ORDER — KETOROLAC TROMETHAMINE 30 MG/ML IJ SOLN
60.0000 mg | Freq: Once | INTRAMUSCULAR | Status: AC
  Administered 2021-07-07: 60 mg via INTRAMUSCULAR
  Filled 2021-07-07: qty 2

## 2021-07-07 MED ORDER — CYCLOBENZAPRINE HCL 10 MG PO TABS: 10.0000 mg | ORAL_TABLET | Freq: Three times a day (TID) | ORAL | 0 refills | Status: DC | PRN

## 2021-07-07 NOTE — ED Notes (Signed)
See triage note  presents with lower back pain for about 3 weeks  states she developed lower back pain after playing basketball  states pain eased off some and then noticed blood in urine about 1 week ago

## 2021-07-07 NOTE — ED Notes (Signed)
POC was NEGATIVE.

## 2021-07-08 ENCOUNTER — Other Ambulatory Visit: Payer: Self-pay | Admitting: Licensed Practical Nurse

## 2021-07-08 DIAGNOSIS — R319 Hematuria, unspecified: Secondary | ICD-10-CM

## 2021-07-31 ENCOUNTER — Ambulatory Visit: Payer: Medicaid Other | Admitting: Urology

## 2021-07-31 ENCOUNTER — Encounter: Payer: Self-pay | Admitting: Urology

## 2022-06-11 ENCOUNTER — Telehealth: Payer: BC Managed Care – PPO | Admitting: Nurse Practitioner

## 2022-06-11 DIAGNOSIS — B3731 Acute candidiasis of vulva and vagina: Secondary | ICD-10-CM | POA: Diagnosis not present

## 2022-06-11 MED ORDER — MICONAZOLE NITRATE 2 % VA CREA: 1.0000 | TOPICAL_CREAM | Freq: Every day | VAGINAL | 0 refills | Status: DC

## 2022-06-11 NOTE — Progress Notes (Signed)
E-Visit for Vaginal Symptoms  We are sorry that you are not feeling well. Here is how we plan to help! Based on what you shared with me it looks like you: May have a yeast vaginosis  Meds ordered this encounter  Medications   miconazole (MONISTAT 7 SIMPLY CURE) 2 % vaginal cream    Sig: Place 1 Applicatorful vaginally at bedtime.    Dispense:  45 g    Refill:  0     Vaginosis is an inflammation of the vagina that can result in discharge, itching and pain. The cause is usually a change in the normal balance of vaginal bacteria or an infection. Vaginosis can also result from reduced estrogen levels after menopause.  The most common causes of vaginosis are:   Bacterial vaginosis which results from an overgrowth of one on several organisms that are normally present in your vagina.   Yeast infections which are caused by a naturally occurring fungus called candida.   Vaginal atrophy (atrophic vaginosis) which results from the thinning of the vagina from reduced estrogen levels after menopause.   Trichomoniasis which is caused by a parasite and is commonly transmitted by sexual intercourse.  Factors that increase your risk of developing vaginosis include: Medications, such as antibiotics and steroids Uncontrolled diabetes Use of hygiene products such as bubble bath, vaginal spray or vaginal deodorant Douching Wearing damp or tight-fitting clothing Using an intrauterine device (IUD) for birth control Hormonal changes, such as those associated with pregnancy, birth control pills or menopause Sexual activity Having a sexually transmitted infection  Your treatment plan is Monistat (miconazole)  Be sure to take all of the medication as directed. Stop taking any medication if you develop a rash, tongue swelling or shortness of breath. Mothers who are breast feeding should consider pumping and discarding their breast milk while on these antibiotics. However, there is no consensus that infant  exposure at these doses would be harmful.  Remember that medication creams can weaken latex condoms. Marland Kitchen   HOME CARE:  Good hygiene may prevent some types of vaginosis from recurring and may relieve some symptoms:  Avoid baths, hot tubs and whirlpool spas. Rinse soap from your outer genital area after a shower, and dry the area well to prevent irritation. Don't use scented or harsh soaps, such as those with deodorant or antibacterial action. Avoid irritants. These include scented tampons and pads. Wipe from front to back after using the toilet. Doing so avoids spreading fecal bacteria to your vagina.  Other things that may help prevent vaginosis include:  Don't douche. Your vagina doesn't require cleansing other than normal bathing. Repetitive douching disrupts the normal organisms that reside in the vagina and can actually increase your risk of vaginal infection. Douching won't clear up a vaginal infection. Use a latex condom. Both female and female latex condoms may help you avoid infections spread by sexual contact. Wear cotton underwear. Also wear pantyhose with a cotton crotch. If you feel comfortable without it, skip wearing underwear to bed. Yeast thrives in [employer] Your symptoms should improve in the next day or two.  GET HELP RIGHT AWAY IF:  You have pain in your lower abdomen ( pelvic area or over your ovaries) You develop nausea or vomiting You develop a fever Your discharge changes or worsens You have persistent pain with intercourse You develop shortness of breath, a rapid pulse, or you faint.  These symptoms could be signs of problems or infections that need to be evaluated by a  medical provider now.  MAKE SURE YOU   Understand these instructions. Will watch your condition. Will get help right away if you are not doing well or get worse.  Thank you for choosing an e-visit.  Your e-visit answers were reviewed by a board certified advanced clinical  practitioner to complete your personal care plan. Depending upon the condition, your plan could have included both over the counter or prescription medications.  Please review your pharmacy choice. Make sure the pharmacy is open so you can pick up prescription now. If there is a problem, you may contact your provider through [employer] and have the prescription routed to another pharmacy.  Your safety is important to [place]. If you have drug allergies check your prescription carefully.   For the next 24 hours you can use MyChart to ask questions about today's visit, request a non-urgent call back, or ask for a work or school excuse. You will get an email in the next two days asking about your experience. I hope that your e-visit has been valuable and will speed your recovery.   I spent approximately 5 minutes reviewing the patient's history, current symptoms and coordinating their care today.

## 2022-09-28 ENCOUNTER — Emergency Department
Admission: EM | Admit: 2022-09-28 | Discharge: 2022-09-28 | Disposition: A | Discharge status: Home / Self Care | Payer: BC Managed Care – PPO | Attending: Emergency Medicine | Admitting: Emergency Medicine

## 2022-09-28 ENCOUNTER — Other Ambulatory Visit: Payer: Self-pay

## 2022-09-28 DIAGNOSIS — W57XXXA Bitten or stung by nonvenomous insect and other nonvenomous arthropods, initial encounter: Secondary | ICD-10-CM | POA: Insufficient documentation

## 2022-09-28 DIAGNOSIS — Z20822 Contact with and (suspected) exposure to covid-19: Secondary | ICD-10-CM | POA: Diagnosis not present

## 2022-09-28 DIAGNOSIS — J029 Acute pharyngitis, unspecified: Secondary | ICD-10-CM | POA: Insufficient documentation

## 2022-09-28 DIAGNOSIS — S70362A Insect bite (nonvenomous), left thigh, initial encounter: Secondary | ICD-10-CM | POA: Diagnosis not present

## 2022-09-28 LAB — RESP PANEL BY RT-PCR (RSV, FLU A&B, COVID)  RVPGX2
Influenza A by PCR: NEGATIVE
Influenza B by PCR: NEGATIVE
Resp Syncytial Virus by PCR: NEGATIVE
SARS Coronavirus 2 by RT PCR: NEGATIVE

## 2022-09-28 MED ORDER — CEPHALEXIN 500 MG PO CAPS: 500.0000 mg | ORAL_CAPSULE | Freq: Four times a day (QID) | ORAL | 0 refills | Status: AC

## 2022-09-28 NOTE — ED Triage Notes (Signed)
Pt presents to the ED POV from home for a possible insect bite to the left lateral thigh. Pt states that she first noticed this on Saturday. Report runny nose, chills, and sore throat that started today. Wound on leg appears to be smaller in size with no noticeable streaking on warmth. Pt A&Ox4 at time of triage. VSS.

## 2022-09-28 NOTE — ED Provider Notes (Signed)
[facility] ([facility]) Provider Note    Event Date/Time   First MD Initiated Contact with Patient 09/28/22 1803     (approximate)   History   Insect Bite   HPI  Catherine [name] is a 32 y.o. female with no PMH presents for evaluation of possible insect bite.  Patient also reports runny nose, chills and sore throat that began today.  Patient states that the insect bite has been very itchy, she has not noticed any drainage from the site.  She denies any fevers.     Physical Exam   Triage Vital Signs: ED Triage Vitals  Encounter Vitals Group     BP 09/28/22 1738 120/87     Systolic BP Percentile --      Diastolic BP Percentile --      Pulse Rate 09/28/22 1738 85     Resp 09/28/22 1738 18     Temp 09/28/22 1738 98.2 F (36.8 C)     Temp Source 09/28/22 1738 Oral     SpO2 09/28/22 1738 98 %     Weight 09/28/22 1739 185 lb (83.9 kg)     Height 09/28/22 1739 5\' 2"  (1.575 m)     Head Circumference --      Peak Flow --      Pain Score 09/28/22 1738 3     Pain Loc --      Pain Education --      Exclude from Growth Chart --     Most recent vital signs: Vitals:   09/28/22 1738  BP: 120/87  Pulse: 85  Resp: 18  Temp: 98.2 F (36.8 C)  SpO2: 98%    General: Awake, no distress.  CV:  Good peripheral perfusion.  RRR. Resp:  Normal effort.  CTAB. Abd:  No distention.  ENT:  Oral mucosas moist, pharynx is erythematous, no tonsillar enlargement or exudates. Other:  There is a small bite site on patient's lateral left thigh, with surrounding erythema, there appears to be a small pustule filled with yellow fluid, she does have a red streak extending from the bite site with some induration.    ED Results / Procedures / Treatments   Labs (all labs ordered are listed, but only abnormal results are displayed) Labs Reviewed  RESP PANEL BY RT-PCR (RSV, FLU A&B, COVID)  [id number]     PROCEDURES:  Critical Care performed: No  Procedures   MEDICATIONS  ORDERED IN ED: Medications - No data to display   IMPRESSION / MDM / ASSESSMENT AND PLAN / ED COURSE  I reviewed the triage vital signs and the nursing notes.                             32 year old female presents for evaluation of insect bite and URI symptoms.  VSS and patient NAD on exam.  Differential diagnosis includes, but is not limited to, insect bite, cellulitis, abscess, strep throat, RSV, COVID, flu, other viral URI.  Patient's presentation is most consistent with acute, uncomplicated illness.  Respiratory panel was negative for flu, COVID and RSV.  Patient was advised on symptomatic management using OTC cold medications.  Believe patient is developing superficial infection at the insect bite site.  I will send her home with a prescription for Keflex.  She can also continue to apply triple antibiotic ointment and hydrocortisone cream if it is itchy.       FINAL CLINICAL IMPRESSION(S) / ED  DIAGNOSES   Final diagnoses:  Insect bite of left thigh, initial encounter     Rx / DC Orders   ED Discharge Orders          Ordered    cephALEXin (KEFLEX) 500 MG capsule  4 times daily        09/28/22 1817             Note:  This document was prepared using Dragon voice recognition software and may include unintentional dictation errors.   Cameron Ali, PA-C 09/28/22 Norva Karvonen, MD 09/28/22 2248

## 2022-09-28 NOTE — Discharge Instructions (Addendum)
Your respiratory panel was negative for COVID, flu and RSV.  You can take over-the-counter cold medication to manage your symptoms.  Please take the antibiotic as prescribed.  You can continue to apply triple antibiotic ointment to the bite site.  You can also apply hydrocortisone cream if it is itchy.

## 2022-11-11 ENCOUNTER — Other Ambulatory Visit: Payer: Self-pay

## 2022-11-11 NOTE — Telephone Encounter (Signed)
Contacted the patient via phone.patient needs annual exam, last seen 02/19/21 with Carie Caddy. I left message asking the patient to contact our office.

## 2022-12-14 DIAGNOSIS — J189 Pneumonia, unspecified organism: Secondary | ICD-10-CM | POA: Diagnosis not present

## 2022-12-14 DIAGNOSIS — R059 Cough, unspecified: Secondary | ICD-10-CM | POA: Diagnosis not present

## 2023-01-07 NOTE — Progress Notes (Deleted)
New patient visit  Patient: Catherine [name]   DOB: [birth year]   32 y.o. Female  MRN: [id number] Visit Date: 01/15/2023  Today's healthcare provider: Debera Lat, PA-C   No chief complaint on file.  Subjective    Catherine [name] is a 32 y.o. female who presents today as a new patient to establish care.  HPI  *** Discussed the use of AI scribe software for clinical note transcription with the patient, who gave verbal consent to proceed.  History of Present Illness            Past Medical History:  Diagnosis Date  . HSV-2 infection 2014   Past Surgical History:  Procedure Laterality Date  . CHOLECYSTECTOMY, LAPAROSCOPIC  2017   Family Status  Relation Name Status  . Father  Deceased  . MGF  Deceased  . PGM  Deceased  . PGF  Deceased  . MGM  Alive  No partnership data on file   Family History  Problem Relation Age of Onset  . Heart attack Father   . Heart attack Paternal Grandmother   . Heart attack Paternal Grandfather    Social History   Socioeconomic History  . Marital status: Single    Spouse name: Not on file  . Number of children: Not on file  . Years of education: Not on file  . Highest education level: Not on file  Occupational History  . Not on file  Tobacco Use  . Smoking status: Never  . Smokeless tobacco: Never  Vaping Use  . Vaping status: Never Used  Substance and Sexual Activity  . Alcohol use: Yes    Comment: occasionally   . Drug use: Never  . Sexual activity: Yes    Birth control/protection: Inserts    Comment: Nuvaring  Other Topics Concern  . Not on file  Social History Narrative  . Not on file   Social Drivers of Health   Financial Resource Strain: Not on file  Food Insecurity: Not on file  Transportation Needs: Not on file  Physical Activity: Not on file  Stress: Not on file  Social Connections: Not on file   Outpatient Medications Prior to Visit  Medication Sig  . cyclobenzaprine (FLEXERIL) 10 MG tablet  Take 1 tablet (10 mg total) by mouth 3 (three) times daily as needed for muscle spasms.  Marland Kitchen ELURYNG 0.12-0.015 MG/24HR vaginal ring INSERT VAGINALLY AND LEAVE IN PLACE FOR 3 CONSECUTIVE WEEKS, THEN REMOVE FOR 1 WEEK  . miconazole (MONISTAT 7 SIMPLY CURE) 2 % vaginal cream Place 1 Applicatorful vaginally at bedtime.  . promethazine-dextromethorphan (PROMETHAZINE-DM) 6.25-15 MG/5ML syrup Take 5 mLs by mouth every 4 (four) hours as needed.   No facility-administered medications prior to visit.   No Known Allergies  Immunization History  Administered Date(s) Administered  . Moderna Sars-Covid-2 Vaccination 07/10/2019    Health Maintenance  Topic Date Due  . HIV Screening  Never done  . Hepatitis C Screening  Never done  . DTaP/Tdap/Td (1 - Tdap) Never done  . INFLUENZA VACCINE  Never done  . COVID-19 Vaccine (2 - 2024-25 season) 09/13/2022  . Cervical Cancer Screening (HPV/Pap Cotest)  02/20/2024  . HPV VACCINES  Aged Out    Patient Care Team: [facility], [place] as PCP - General  Review of Systems  All other systems reviewed and are negative.  Except see HPI   {Insert previous labs (optional):23779} {See past labs  Heme  Chem  Endocrine  Serology  Results Review (  optional):1}   Objective    There were no vitals taken for this visit. {Insert last BP/Wt (optional):23777}{See vitals history (optional):1}   Physical Exam Vitals reviewed.  Constitutional:      General: She is not in acute distress.    Appearance: Normal appearance. She is well-developed. She is not diaphoretic.  HENT:     Head: Normocephalic and atraumatic.  Eyes:     General: No scleral icterus.    Conjunctiva/sclera: Conjunctivae normal.  Neck:     Thyroid: No thyromegaly.  Cardiovascular:     Rate and Rhythm: Normal rate and regular rhythm.     Pulses: Normal pulses.     Heart sounds: Normal heart sounds. No murmur heard. Pulmonary:     Effort: Pulmonary effort is normal. No  respiratory distress.     Breath sounds: Normal breath sounds. No wheezing, rhonchi or rales.  Musculoskeletal:     Cervical back: Neck supple.     Right lower leg: No edema.     Left lower leg: No edema.  Lymphadenopathy:     Cervical: No cervical adenopathy.  Skin:    General: Skin is warm and dry.     Findings: No rash.  Neurological:     Mental Status: She is alert and oriented to person, place, and time. Mental status is at baseline.  Psychiatric:        Mood and Affect: Mood normal.        Behavior: Behavior normal.    Depression Screen    03/21/2018    4:58 PM 03/07/2018    4:22 PM 12/13/2017    1:39 PM 11/15/2017    2:08 PM  PHQ 2/9 Scores  PHQ - 2 Score 2 5 3  0  PHQ- 9 Score 12 22 13 2    No results found for any visits on 01/15/23.  Assessment & Plan     *** Assessment and Plan              Encounter to establish care Welcomed to our clinic Reviewed past medical hx, social hx, family hx and surgical hx Pt advised to send all vaccination records or screening   No follow-ups on file.    The patient was advised to call back or seek an in-person evaluation if the symptoms worsen or if the condition fails to improve as anticipated.  I discussed the assessment and treatment plan with the patient. The patient was provided an opportunity to ask questions and all were answered. The patient agreed with the plan and demonstrated an understanding of the instructions.  I, Debera Lat, PA-C have reviewed all documentation for this visit. The documentation on  01/15/2023   for the exam, diagnosis, procedures, and orders are all accurate and complete.  Debera Lat, [facility], MMS [facility] [phone] (phone) [phone] (fax)  [facility]

## 2023-01-15 ENCOUNTER — Ambulatory Visit: Payer: BC Managed Care – PPO | Admitting: Physician Assistant

## 2023-01-18 NOTE — Telephone Encounter (Signed)
 Contacted the patient via phone. I left message asking the patient to contact our office. She is past due for annual exam with Carie Caddy

## 2023-01-20 NOTE — Telephone Encounter (Signed)
 Contacted the patient via phone, I left message asking the patient to contact the office for scheduling. Attempts to reach the patient have been unsuccessful.

## 2023-02-04 ENCOUNTER — Ambulatory Visit: Payer: BC Managed Care – PPO | Admitting: Family Medicine

## 2023-02-04 NOTE — Progress Notes (Deleted)
 New patient visit   Patient: Catherine [name]   DOB: May 25, 1990   32 y.o. Female  MRN: [id number] Visit Date: 02/04/2023  Today's healthcare provider: Ronnald Ramp, MD   No chief complaint on file.  Subjective    Catherine [name] is a 33 y.o. female who presents today as a new patient to establish care.      Discussed the use of AI scribe software for clinical note transcription with the patient, who gave verbal consent to proceed.  History of Present Illness              Past Medical History:  Diagnosis Date   HSV-2 infection 2014    Outpatient Medications Prior to Visit  Medication Sig   cyclobenzaprine (FLEXERIL) 10 MG tablet Take 1 tablet (10 mg total) by mouth 3 (three) times daily as needed for muscle spasms.   ELURYNG 0.12-0.015 MG/24HR vaginal ring INSERT VAGINALLY AND LEAVE IN PLACE FOR 3 CONSECUTIVE WEEKS, THEN REMOVE FOR 1 WEEK   miconazole (MONISTAT 7 SIMPLY CURE) 2 % vaginal cream Place 1 Applicatorful vaginally at bedtime.   promethazine-dextromethorphan (PROMETHAZINE-DM) 6.25-15 MG/5ML syrup Take 5 mLs by mouth every 4 (four) hours as needed.   No facility-administered medications prior to visit.    Past Surgical History:  Procedure Laterality Date   CHOLECYSTECTOMY, LAPAROSCOPIC  2017   Family Status  Relation Name Status   Father  Deceased   MGF  Deceased   PGM  Deceased   PGF  Deceased   MGM  Alive  No partnership data on file   Family History  Problem Relation Age of Onset   Heart attack Father    Heart attack Paternal Grandmother    Heart attack Paternal Grandfather    Social History   Socioeconomic History   Marital status: Single    Spouse name: Not on file   Number of children: Not on file   Years of education: Not on file   Highest education level: Not on file  Occupational History   Not on file  Tobacco Use   Smoking status: Never   Smokeless tobacco: Never  Vaping Use   Vaping status: Never Used   Substance and Sexual Activity   Alcohol use: Yes    Comment: occasionally    Drug use: Never   Sexual activity: Yes    Birth control/protection: Inserts    Comment: Nuvaring  Other Topics Concern   Not on file  Social History Narrative   Not on file   Social Drivers of Health   Financial Resource Strain: Not on file  Food Insecurity: Not on file  Transportation Needs: Not on file  Physical Activity: Not on file  Stress: Not on file  Social Connections: Not on file     No Known Allergies  Immunization History  Administered Date(s) Administered   Moderna Sars-Covid-2 Vaccination 07/10/2019    Health Maintenance  Topic Date Due   HIV Screening  Never done   Hepatitis C Screening  Never done   DTaP/Tdap/Td (1 - Tdap) Never done   INFLUENZA VACCINE  Never done   COVID-19 Vaccine (2 - 2024-25 season) 09/13/2022   Cervical Cancer Screening (HPV/Pap Cotest)  02/20/2024   HPV VACCINES  Aged Out    Patient Care Team: [facility], [place] as PCP - General  Review of Systems  {Insert previous labs (optional):23779} {See past labs  Heme  Chem  Endocrine  Serology  Results Review (optional):1}  Objective    There were no vitals taken for this visit. {Insert last BP/Wt (optional):23777}{See vitals history (optional):1}    Depression Screen    03/21/2018    4:58 PM 03/07/2018    4:22 PM 12/13/2017    1:39 PM 11/15/2017    2:08 PM  PHQ 2/9 Scores  PHQ - 2 Score 2 5 3  0  PHQ- 9 Score 12 22 13 2    No results found for any visits on 02/04/23.   Physical Exam ***    Assessment & Plan      Problem List Items Addressed This Visit   None   Assessment and Plan               No follow-ups on file.      Ronnald Ramp, MD  [facility] [phone] (phone) [phone] (fax)  [facility]

## 2023-02-12 ENCOUNTER — Ambulatory Visit: Payer: Medicaid Other | Admitting: Physician Assistant

## 2023-09-27 ENCOUNTER — Other Ambulatory Visit: Payer: Self-pay

## 2023-09-27 ENCOUNTER — Emergency Department
Admission: EM | Admit: 2023-09-27 | Discharge: 2023-09-27 | Disposition: A | Discharge status: Home / Self Care | Attending: Emergency Medicine | Admitting: Emergency Medicine

## 2023-09-27 DIAGNOSIS — R1010 Upper abdominal pain, unspecified: Secondary | ICD-10-CM | POA: Insufficient documentation

## 2023-09-27 DIAGNOSIS — R197 Diarrhea, unspecified: Secondary | ICD-10-CM | POA: Insufficient documentation

## 2023-09-27 DIAGNOSIS — R112 Nausea with vomiting, unspecified: Secondary | ICD-10-CM | POA: Insufficient documentation

## 2023-09-27 LAB — COMPREHENSIVE METABOLIC PANEL WITH GFR
ALT: 29 U/L (ref 0–44)
AST: 32 U/L (ref 15–41)
Albumin: 4 g/dL (ref 3.5–5.0)
Alkaline Phosphatase: 49 U/L (ref 38–126)
Anion gap: 11 (ref 5–15)
BUN: 28 mg/dL — ABNORMAL HIGH (ref 6–20)
CO2: 23 mmol/L (ref 22–32)
Calcium: 9.3 mg/dL (ref 8.9–10.3)
Chloride: 105 mmol/L (ref 98–111)
Creatinine, Ser: 0.61 mg/dL (ref 0.44–1.00)
GFR, Estimated: >60 mL/min (ref >60)
Glucose, Bld: 100 mg/dL — ABNORMAL HIGH (ref 70–99)
Potassium: 4.4 mmol/L (ref 3.5–5.1)
Sodium: 139 mmol/L (ref 135–145)
Total Bilirubin: 0.8 mg/dL (ref 0.0–1.2)
Total Protein: 7.3 g/dL (ref 6.5–8.1)

## 2023-09-27 LAB — LIPASE, BLOOD: Lipase: 26 U/L (ref 11–51)

## 2023-09-27 LAB — URINALYSIS, ROUTINE W REFLEX MICROSCOPIC
Bilirubin Urine: NEGATIVE
Glucose, UA: NEGATIVE mg/dL
Ketones, ur: NEGATIVE mg/dL
Leukocytes,Ua: NEGATIVE
Nitrite: NEGATIVE
Protein, ur: NEGATIVE mg/dL
Specific Gravity, Urine: 1.028 (ref 1.005–1.030)
pH: 5 (ref 5.0–8.0)

## 2023-09-27 LAB — CBC
HCT: 42.3 % (ref 36.0–46.0)
Hemoglobin: 14.1 g/dL (ref 12.0–15.0)
MCH: 30.7 pg (ref 26.0–34.0)
MCHC: 33.3 g/dL (ref 30.0–36.0)
MCV: 92.2 fL (ref 80.0–100.0)
Platelets: 263 K/uL (ref 150–400)
RBC: 4.59 MIL/uL (ref 3.87–5.11)
RDW: 14.4 % (ref 11.5–15.5)
WBC: 7.5 K/uL (ref 4.0–10.5)
nRBC: 0 % (ref 0.0–0.2)

## 2023-09-27 LAB — PREGNANCY, URINE: Preg Test, Ur: NEGATIVE

## 2023-09-27 MED ORDER — DICYCLOMINE HCL 10 MG PO CAPS
20.0000 mg | ORAL_CAPSULE | Freq: Once | ORAL | Status: DC
  Filled 2023-09-27: qty 2

## 2023-09-27 MED ORDER — PANTOPRAZOLE SODIUM 40 MG PO TBEC
40.0000 mg | DELAYED_RELEASE_TABLET | Freq: Once | ORAL | Status: AC
  Administered 2023-09-27: 40 mg via ORAL
  Filled 2023-09-27: qty 1

## 2023-09-27 MED ORDER — ALUM & MAG HYDROXIDE-SIMETH 200-200-20 MG/5ML PO SUSP
30.0000 mL | Freq: Once | ORAL | Status: AC
  Administered 2023-09-27: 30 mL via ORAL
  Filled 2023-09-27: qty 30

## 2023-09-27 MED ORDER — ONDANSETRON 4 MG PO TBDP
4.0000 mg | ORAL_TABLET | Freq: Once | ORAL | Status: AC | PRN
  Administered 2023-09-27: 4 mg via ORAL
  Filled 2023-09-27: qty 1

## 2023-09-27 MED ORDER — DICYCLOMINE HCL 20 MG PO TABS
20.0000 mg | ORAL_TABLET | Freq: Once | ORAL | Status: DC
  Administered 2023-09-27: 20 mg via ORAL
  Filled 2023-09-27: qty 1

## 2023-09-27 MED ORDER — OMEPRAZOLE MAGNESIUM 20 MG PO TBEC: 20.0000 mg | DELAYED_RELEASE_TABLET | Freq: Every day | ORAL | 0 refills | Status: AC

## 2023-09-27 MED ORDER — DICYCLOMINE HCL 20 MG PO TABS
20.0000 mg | ORAL_TABLET | Freq: Four times a day (QID) | ORAL | 0 refills | Status: AC | PRN
Start: 2023-09-27 — End: 2023-10-04

## 2023-09-27 MED ORDER — SUCRALFATE 1 G PO TABS: 1.0000 g | ORAL_TABLET | Freq: Three times a day (TID) | ORAL | 0 refills | Status: AC | PRN

## 2023-09-27 MED ORDER — ONDANSETRON HCL 4 MG PO TABS: 4.0000 mg | ORAL_TABLET | Freq: Four times a day (QID) | ORAL | 0 refills | Status: AC | PRN

## 2023-09-27 NOTE — ED Notes (Signed)
 See triage note  Presents with intermittent mid abd pain  States this started couple of weeks ago  Had been intermittent   States pain has been sharp and makes her sweat and then nauseated

## 2023-09-27 NOTE — Discharge Instructions (Signed)
 You were seen in the Emergency Department today for evaluation of your abdominal pain. Fortunately, your exam and labs were reassuring against an emergency cause for your pain. Please follow-up with your primary care doctor for reevaluation. Return to the ER for any new or worsening symptoms including worsening pain, inability to tolerate food or liquids, or any other new or concerning symptoms.  I sent prescriptions for PPI called omeprazole  to help with your stomach acid, sucralfate , and Bentyl  to help with pain, and Zofran  to help with nausea.

## 2023-09-27 NOTE — ED Triage Notes (Signed)
 Pt arrives with c/o ABD pain that started about 2 weeks ago. Pt reports n/v and stools have been loose. Pt denies fevers.

## 2023-09-27 NOTE — ED Provider Notes (Signed)
 [facility] Provider Note    Event Date/Time   First MD Initiated Contact with Patient 09/27/23 1458     (approximate)   History   Abdominal Pain   HPI  Catherine [name] is a 33 year old female presenting to the emergency department for evaluation of abdominal pain.  Reports over the past 2 weeks she has had intermittent upper abdominal pain with associated vomiting and diarrhea.  History of cholecystectomy.  No fevers.  Has been tolerating some p.o. intake.  No chest pain or shortness of breath.     Physical Exam   Triage Vital Signs: ED Triage Vitals  Encounter Vitals Group     BP 09/27/23 1411 123/85     Girls Systolic BP Percentile --      Girls Diastolic BP Percentile --      Boys Systolic BP Percentile --      Boys Diastolic BP Percentile --      Pulse Rate 09/27/23 1411 86     Resp 09/27/23 1411 19     Temp 09/27/23 1411 98.2 F (36.8 C)     Temp Source 09/27/23 1411 Oral     SpO2 09/27/23 1411 100 %     Weight 09/27/23 1410 185 lb (83.9 kg)     Height 09/27/23 1433 5' 2 (1.575 m)     Head Circumference --      Peak Flow --      Pain Score 09/27/23 1409 7     Pain Loc --      Pain Education --      Exclude from Growth Chart --     Most recent vital signs: Vitals:   09/27/23 1411  BP: 123/85  Pulse: 86  Resp: 19  Temp: 98.2 F (36.8 C)  SpO2: 100%     General: Awake, interactive  CV:  Regular rate, good peripheral perfusion.  Resp:  Unlabored respirations.  Abd:  Nondistended, soft, mild tenderness of the upper abdomen without rebound or guarding. Neuro:  Symmetric facial movement, fluid speech   ED Results / Procedures / Treatments   Labs (all labs ordered are listed, but only abnormal results are displayed) Labs Reviewed  COMPREHENSIVE METABOLIC PANEL WITH GFR - Abnormal; Notable for the following components:      Result Value   Glucose, Bld 100 (*)    BUN 28 (*)    All other components within normal limits   URINALYSIS, ROUTINE W REFLEX MICROSCOPIC - Abnormal; Notable for the following components:   Color, Urine YELLOW (*)    APPearance CLOUDY (*)    Hgb urine dipstick SMALL (*)    Bacteria, UA RARE (*)    All other components within normal limits  LIPASE, BLOOD  CBC  PREGNANCY, URINE     EKG EKG independently reviewed and interpreted by myself demonstrates:    RADIOLOGY Imaging independently reviewed and interpreted by myself demonstrates:   Formal Radiology Read:  No results found.  PROCEDURES:  Critical Care performed: No  Procedures   MEDICATIONS ORDERED IN ED: Medications  dicyclomine  (BENTYL ) capsule 20 mg (20 mg Oral Not Given 09/27/23 1549)  ondansetron  (ZOFRAN -ODT) disintegrating tablet 4 mg (4 mg Oral Given 09/27/23 1414)  alum & mag hydroxide-simeth (MAALOX/MYLANTA) 200-200-20 MG/5ML suspension 30 mL (30 mLs Oral Given 09/27/23 1548)  pantoprazole  (PROTONIX ) EC tablet 40 mg (40 mg Oral Given 09/27/23 1546)     IMPRESSION / MDM / ASSESSMENT AND PLAN / ED COURSE  I reviewed the  triage vital signs and the nursing notes.  Differential diagnosis includes, but is not limited to, pancreatitis, gastritis, lower suspicion biliary pathology given history of cholecystectomy, PUD, lower suspicion other acute intra-abdominal process given overall reassuring abdominal exam  Patient's presentation is most consistent with acute presentation with potential threat to life or bodily function.  33 year old female presenting to the emergency department for evaluation of abdominal pain x 2 weeks.  Stable vitals on presentation.  Reassuring CBC, CMP, normal lipase.  Negative UPT.  UA without evidence of infection.  Overall reassuring abdominal exam here, do not think there is an indication for imaging.  Patient treated symptomatically with Zofran , GI cocktail, Bentyl , Protonix .  Does report improved symptoms on reevaluation.  Suspect likely gastritis contributing to her symptoms.  Will DC  with prescriptions for omeprazole , Bentyl , Zofran , and sucralfate .  Strict return precautions provided.  Patient discharged in stable condition.      FINAL CLINICAL IMPRESSION(S) / ED DIAGNOSES   Final diagnoses:  Pain of upper abdomen     Rx / DC Orders   ED Discharge Orders          Ordered    ondansetron  (ZOFRAN ) 4 MG tablet  Every 6 hours PRN        09/27/23 1616    sucralfate  (CARAFATE ) 1 g tablet  Every 8 hours PRN        09/27/23 1616    omeprazole  (PRILOSEC  OTC) 20 MG tablet  Daily        09/27/23 1616    dicyclomine  (BENTYL ) 20 MG tablet  Every 6 hours PRN        09/27/23 1616             Note:  This document was prepared using Dragon voice recognition software and may include unintentional dictation errors.   Levander Slate, MD 09/27/23 [phone]

## 2023-10-07 DIAGNOSIS — F17210 Nicotine dependence, cigarettes, uncomplicated: Secondary | ICD-10-CM | POA: Diagnosis not present

## 2023-10-07 DIAGNOSIS — R1084 Generalized abdominal pain: Secondary | ICD-10-CM | POA: Diagnosis not present

## 2023-10-07 DIAGNOSIS — R197 Diarrhea, unspecified: Secondary | ICD-10-CM | POA: Diagnosis not present

## 2023-10-07 DIAGNOSIS — R1033 Periumbilical pain: Secondary | ICD-10-CM | POA: Diagnosis not present

## 2023-10-07 DIAGNOSIS — R109 Unspecified abdominal pain: Secondary | ICD-10-CM | POA: Diagnosis not present

## 2023-12-10 IMAGING — CT CT ABD-PELV W/ CM
2 of 4 series · 16 of 46 positions shown, 18 images · IV contrast (APPLIED)
Comparison: None.

CLINICAL DATA: Vomiting and diarrhea for several days, initial
encounter

EXAM:
CT ABDOMEN AND PELVIS WITH CONTRAST
TECHNIQUE: Multidetector CT imaging of the abdomen and pelvis was performed
using the standard protocol following bolus administration of
intravenous contrast.

[Series 2: abdomen 5.0 · axial · 0.68mm/px · z∈[-992,-586]mm · 13 of 93 slices shown, 15 images]
[im 6/93  soft-tissue]
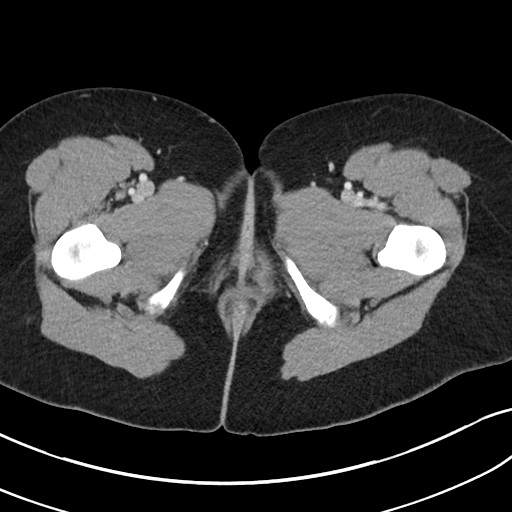
[im 6/93  bone]
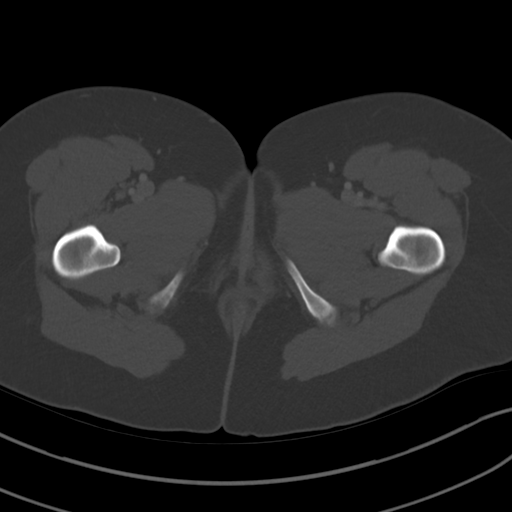
[im 11/93  soft-tissue]
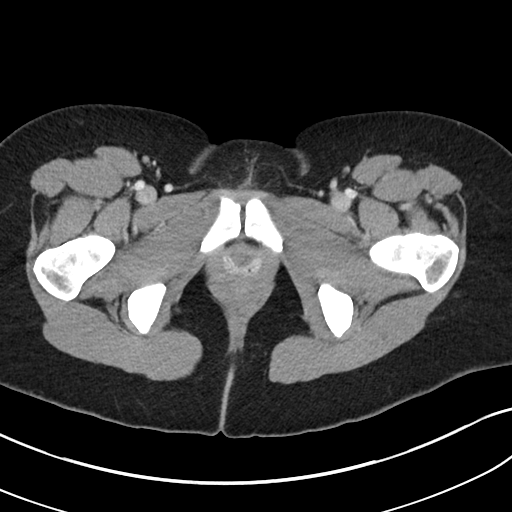
[im 21/93  soft-tissue]
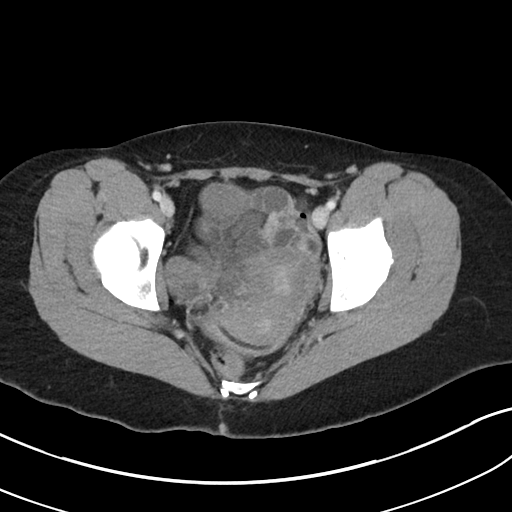
[im 26/93  soft-tissue]
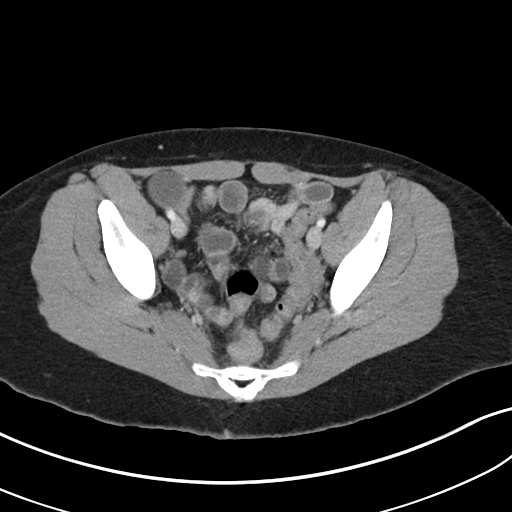
[im 31/93  soft-tissue]
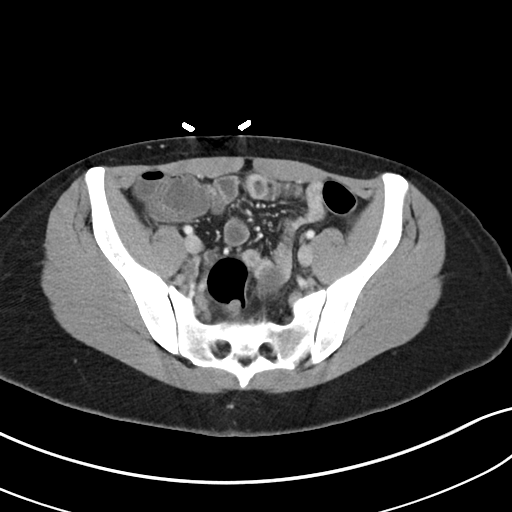
[im 41/93  soft-tissue]
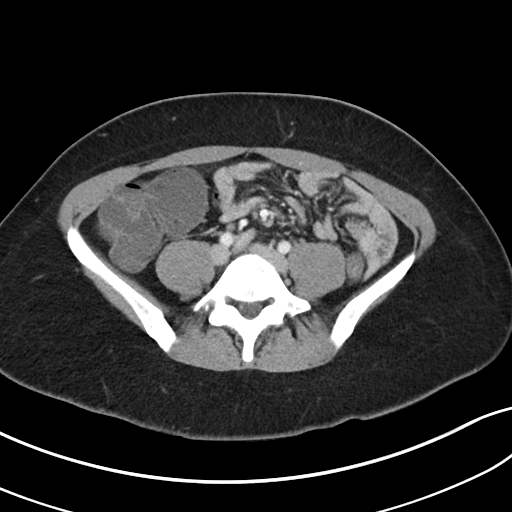
[im 47/93  soft-tissue]
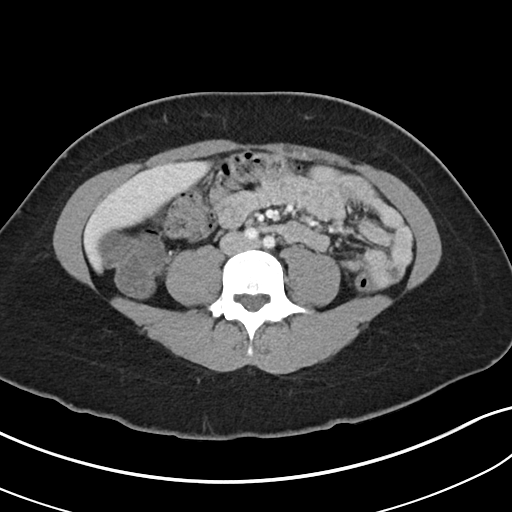
[im 52/93  soft-tissue]
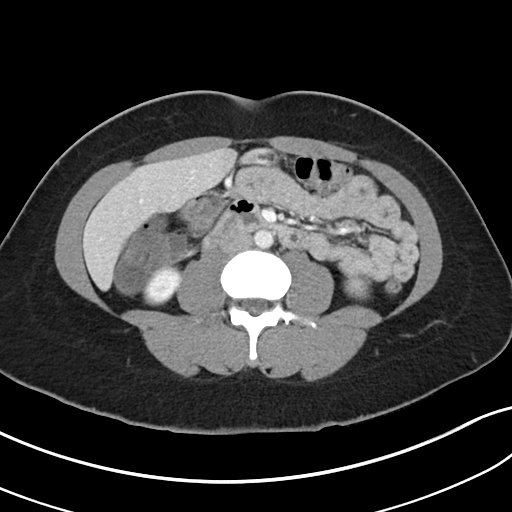
[im 62/93  soft-tissue]
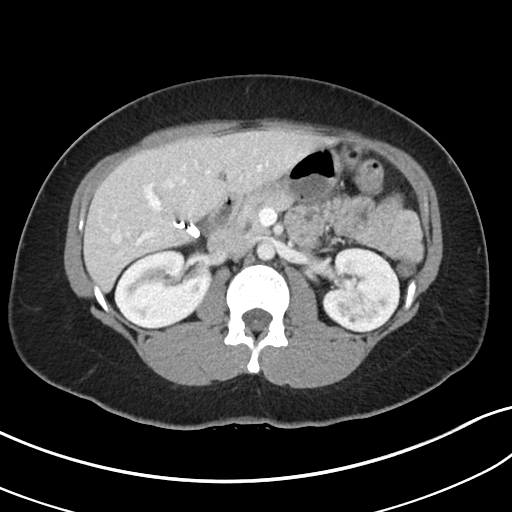
[im 62/93  bone]
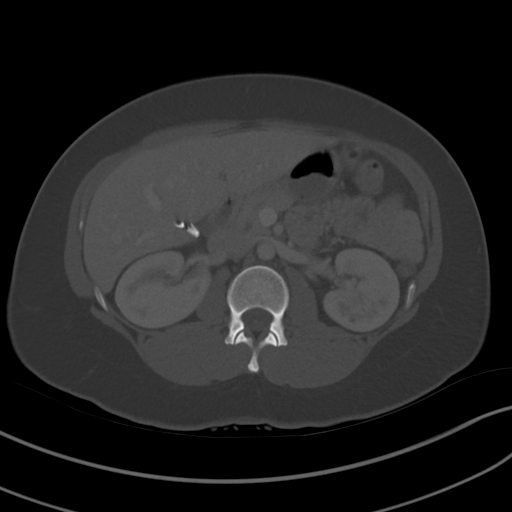
[im 67/93  soft-tissue]
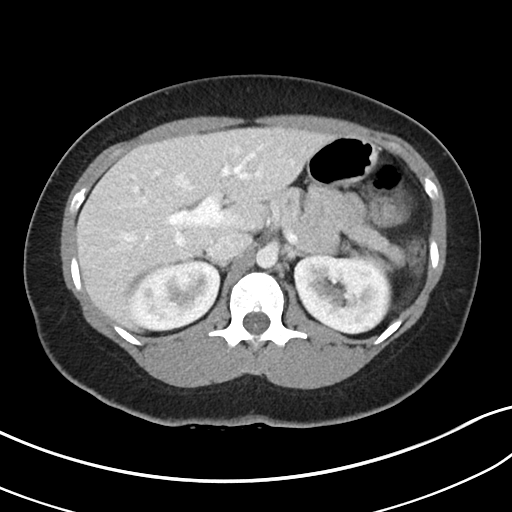
[im 72/93  soft-tissue]
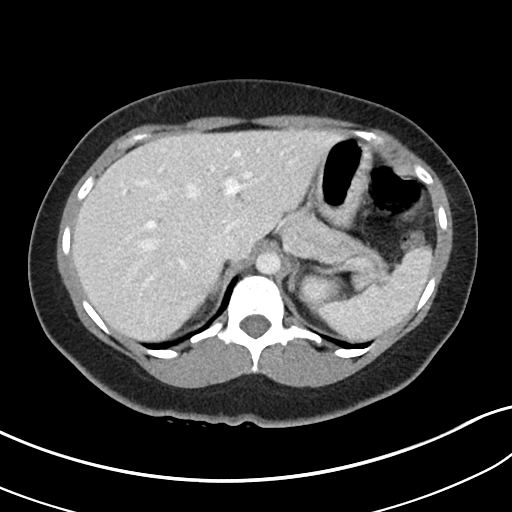
[im 82/93  soft-tissue]
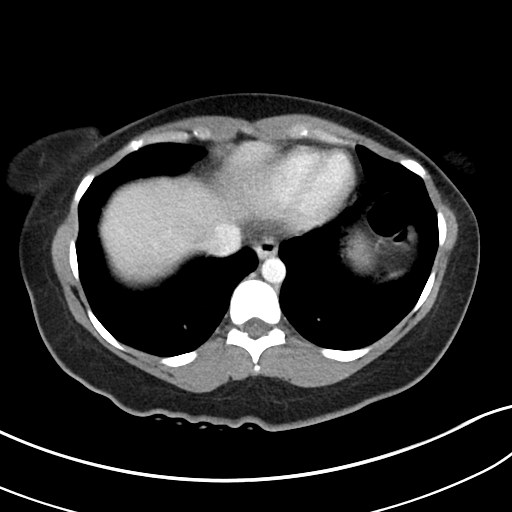
[im 87/93  soft-tissue]
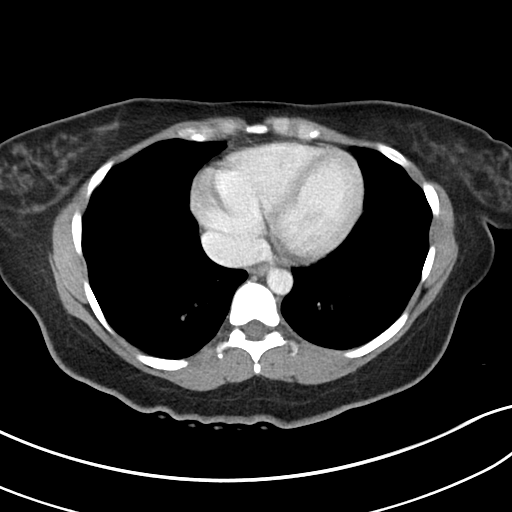

[Series 5: abdomen 3.0 mpr cor · coronal · 0.85mm/px · 3 of 85 slices shown]
[im 29/85  soft-tissue]
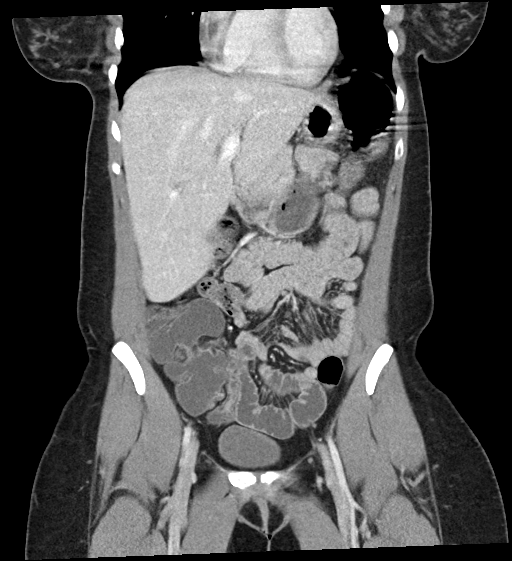
[im 38/85  soft-tissue]
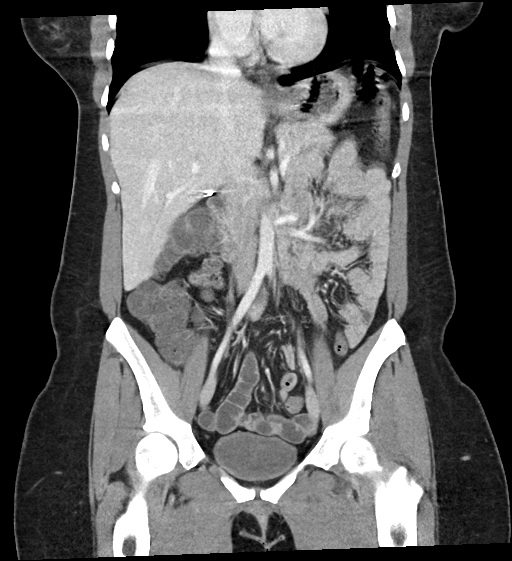
[im 47/85  soft-tissue]
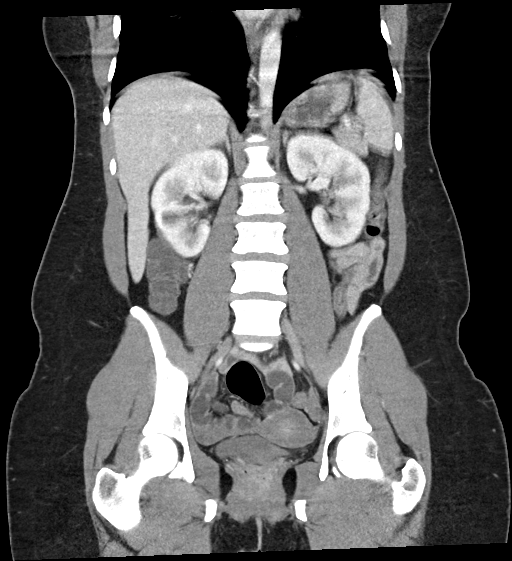

[16 of 46 positions shown; findings below may reference images not displayed]

RADIATION DOSE REDUCTION: This exam was performed according to the
departmental dose-optimization program which includes automated
exposure control, adjustment of the mA and/or kV according to
patient size and/or use of iterative reconstruction technique.

CONTRAST:  100mL OMNIPAQUE IOHEXOL 300 MG/ML  SOLN
FINDINGS: Lower chest: No acute abnormality.

Hepatobiliary: No focal liver abnormality is seen. Status post
cholecystectomy. No biliary dilatation.

Pancreas: Unremarkable. No pancreatic ductal dilatation or
surrounding inflammatory changes.

Spleen: Normal in size without focal abnormality.

Adrenals/Urinary Tract: Adrenal glands are within normal limits.
Kidneys are well visualized bilaterally. 15 mm cyst is noted in the
midportion of right kidney. No renal calculi or obstructive changes
are seen. Bladder is partially distended.

Stomach/Bowel: The appendix is within normal limits. No obstructive
or inflammatory changes of colon are seen. Small bowel and stomach
are within normal limits.

Vascular/Lymphatic: No significant vascular findings are present. No
enlarged abdominal or pelvic lymph nodes.

Reproductive: Uterus and bilateral adnexa are unremarkable. Vaginal
ring is noted consistent with contraceptive device

Other: No abdominal wall hernia or abnormality. No abdominopelvic
ascites.

Musculoskeletal: No acute or significant osseous findings.
IMPRESSION: No acute abnormality noted.

15 mm right renal cyst which appears simple in nature. No follow-up
exam is recommended.

## 2023-12-22 ENCOUNTER — Telehealth: Admitting: Family Medicine

## 2023-12-22 DIAGNOSIS — H00011 Hordeolum externum right upper eyelid: Secondary | ICD-10-CM

## 2023-12-22 MED ORDER — ERYTHROMYCIN 5 MG/GM OP OINT: 1.0000 | TOPICAL_OINTMENT | Freq: Four times a day (QID) | OPHTHALMIC | 0 refills | Status: AC

## 2023-12-22 NOTE — Progress Notes (Signed)
 We are sorry that you are not feeling well. Here is how we plan to help!  Based on what you have shared with me it looks like you have a stye.  A stye is an inflammation of the eyelid.  It is often a red, painful lump near the edge of the eyelid that may look like a boil or a pimple.  A stye develops when an infection occurs at the base of an eyelash.   We have made appropriate suggestions for you based upon your presentation: I have prescribed Erythromycin Ophthalmic ointment 0.5% Apply topically in affected eye daily at bedtime for 5 days. To apply: Tilt the head back and, pressing your finger gently on the skin just beneath the lower eyelid, pull the lower eyelid away from the eye to make a space. Squeeze a thin strip of ointment into this space. A 1-cm (approximately 1/3-inch) strip of ointment is usually enough, unless you have been told by your doctor to use a different amount. Let go of the eyelid and gently close the eyes. Keep the eyes closed for 1 or 2 minutes to allow the medicine to come into contact with the infection.      HOME CARE:  Wash your hands often! Let the stye open on its own. Don't squeeze or open it. Don't rub your eyes. This can irritate your eyes and let in bacteria.  If you need to touch your eyes, wash your hands first. Don't wear eye makeup or contact lenses until the area has healed.  GET HELP RIGHT AWAY IF:  Your symptoms do not improve. You develop blurred or loss of vision. Your symptoms worsen (increased discharge, pain or redness).  Thank you for choosing an e-visit.  Your e-visit answers were reviewed by a board certified advanced clinical practitioner to complete your personal care plan.  Depending upon the condition, your plan could have included both over the counter or prescription medications.  Please review your pharmacy choice.  Make sure the pharmacy is open so you can pick up prescription now.  If there is a problem, you may contact your provider  through [employer] and have the prescription routed to another pharmacy.    Your safety is important to us .  If you have drug allergies check your prescription carefully.  For the next 24 hours you can use MyChart to ask questions about today's visit, request a non-urgent call back, or ask for a work or school excuse.  You will get an email in the next two days asking about your experience.  I hope you that your e-visit has been valuable and will speed your recovery.  I have spent 5 minutes in review of e-visit questionnaire, review and updating patient chart, medical decision making and response to patient.   Pamelyn Bancroft, FNP

## 2023-12-29 ENCOUNTER — Emergency Department: Payer: Worker's Compensation

## 2023-12-29 ENCOUNTER — Other Ambulatory Visit: Payer: Self-pay

## 2023-12-29 ENCOUNTER — Emergency Department
Admission: EM | Admit: 2023-12-29 | Discharge: 2023-12-29 | Disposition: A | Discharge status: Home / Self Care | Payer: Worker's Compensation | Source: Home / Self Care | Attending: Emergency Medicine | Admitting: Emergency Medicine

## 2023-12-29 ENCOUNTER — Encounter: Payer: Self-pay | Admitting: Emergency Medicine

## 2023-12-29 DIAGNOSIS — X501XXA Overexertion from prolonged static or awkward postures, initial encounter: Secondary | ICD-10-CM | POA: Insufficient documentation

## 2023-12-29 DIAGNOSIS — M25511 Pain in right shoulder: Secondary | ICD-10-CM | POA: Insufficient documentation

## 2023-12-29 DIAGNOSIS — Y990 Civilian activity done for income or pay: Secondary | ICD-10-CM | POA: Insufficient documentation

## 2023-12-29 MED ORDER — METHOCARBAMOL 500 MG PO TABS: 500.0000 mg | ORAL_TABLET | Freq: Three times a day (TID) | ORAL | 0 refills | Status: AC | PRN

## 2023-12-29 MED ORDER — KETOROLAC TROMETHAMINE 15 MG/ML IJ SOLN: 15.0000 mg | Freq: Once | INTRAMUSCULAR | Status: DC

## 2023-12-29 MED ORDER — METHOCARBAMOL 500 MG PO TABS
1000.0000 mg | ORAL_TABLET | Freq: Once | ORAL | Status: AC
  Administered 2023-12-29: 12:00:00 1000 mg via ORAL
  Filled 2023-12-29: qty 2

## 2023-12-29 MED ORDER — ACETAMINOPHEN 500 MG PO TABS
1000.0000 mg | ORAL_TABLET | Freq: Once | ORAL | Status: AC
  Administered 2023-12-29: 12:00:00 1000 mg via ORAL
  Filled 2023-12-29: qty 2

## 2023-12-29 MED ORDER — MELOXICAM 15 MG PO TABS: 15.0000 mg | ORAL_TABLET | Freq: Every day | ORAL | 0 refills | Status: AC

## 2023-12-29 MED ORDER — KETOROLAC TROMETHAMINE 15 MG/ML IJ SOLN
15.0000 mg | Freq: Once | INTRAMUSCULAR | Status: AC
  Administered 2023-12-29: 13:00:00 15 mg via INTRAMUSCULAR
  Filled 2023-12-29: qty 1

## 2023-12-29 NOTE — ED Provider Notes (Signed)
 [facility] Provider Note    Event Date/Time   First MD Initiated Contact with Patient 12/29/23 1140     (approximate)   History   Shoulder Pain   HPI  Catherine [name] is a 33 y.o. female  with a past medical history of HSV 2 presents to the emergency department with posterior right shoulder pain described as throbbing after an injury at work today.  Patient is mother and supervisor are present in the room with her.  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  She denies any neck pain, pain radiating down her arm, fall, any other injuries.  She denies any history of neck or shoulder surgeries.  She has not taken any medication for pain today.   Physical Exam   Triage Vital Signs: ED Triage Vitals  Encounter Vitals Group     BP 12/29/23 1133 118/72     Girls Systolic BP Percentile --      Girls Diastolic BP Percentile --      Boys Systolic BP Percentile --      Boys Diastolic BP Percentile --      Pulse Rate 12/29/23 1133 85     Resp 12/29/23 1133 18     Temp 12/29/23 1133 97.9 F (36.6 C)     Temp Source 12/29/23 1133 Oral     SpO2 12/29/23 1133 100 %     Weight 12/29/23 1132 172 lb (78 kg)     Height 12/29/23 1132 5' 3 (1.6 m)     Head Circumference --      Peak Flow --      Pain Score 12/29/23 1132 8     Pain Loc --      Pain Education --      Exclude from Growth Chart --     Most recent vital signs: Vitals:   12/29/23 1133  BP: 118/72  Pulse: 85  Resp: 18  Temp: 97.9 F (36.6 C)  SpO2: 100%    General: Awake, in no acute distress. Appears stated age. Head: Normocephalic, atraumatic. Ears/Nose/Throat: Nares patent, no nasal discharge. Neck: Supple, no nuchal rigidity. CV: Good peripheral perfusion. Radial pulses 2+ b/l. Respiratory:Normal respiratory effort.  No respiratory  distress. CTAB. GI: Soft, non-distended, non-tender.  MSK: Normal ROM and  5/5 strength in b/l shoulders, elbows, gripping motions as well as cervical spine. TTP along right posterior scapula region. No tenderness to clavicle, anterior right shoulder or biceps tendon. Skin:Warm, dry, intact.  Neurological: A&Ox4 to person, place, time, and situation. Strength symmetric in b/l upper extremities. No focal deficits. No midline cervical, thoracic, or lumbar tenderness.  ED Results / Procedures / Treatments   Labs (all labs ordered are listed, but only abnormal results are displayed) Labs Reviewed - No data to display   EKG     RADIOLOGY X ray right shoulder  FINDINGS: No acute fracture or dislocation. There is no evidence of arthropathy or other focal bone abnormality. Soft tissues are unremarkable.   IMPRESSION: No acute fracture or dislocation.  PROCEDURES:  Critical Care performed: No   Procedures   MEDICATIONS ORDERED IN ED: Medications  acetaminophen  (TYLENOL ) tablet 1,000 mg (1,000 mg Oral Given 12/29/23 1229)  methocarbamol  (ROBAXIN ) tablet 1,000 mg (1,000 mg Oral Given 12/29/23 1229)  ketorolac  (TORADOL ) 15 MG/ML injection 15 mg (15 mg  Intramuscular Given 12/29/23 1231)     IMPRESSION / MDM / ASSESSMENT AND PLAN / ED COURSE  I reviewed the triage vital signs and the nursing notes.                              Differential diagnosis includes, but is not limited to, shoulder/MSK strain, shoulder fracture, shoulder dislocation, clavicle fracture  Patient's presentation is most consistent with acute complicated illness / injury requiring diagnostic workup.  Patient is a 33 year old female here with right shoulder injury that occurred at work today.  Supervisor was present with her in the room and patient will be filing Worker's Comp paperwork following today's visit.  She is neurovascularly intact.  She has good range of motion of her shoulder and no neck or  spinal tenderness.  X-ray of the right shoulder ordered in triage. I independently viewed the x-ray and radiologist's report.  I agree with the radiologist's report there is no acute fracture or dislocation.  Patient was given Toradol , Robaxin  and Tylenol  in the emergency department.  She reports her pain has slightly improved on reevaluation.  Discussed negative x-ray with her.  Will place in shoulder sling.  She does not have a PCP, so I did give her a referral to establish with one.  She can follow-up with the orthopedic specialist outpatient in 1 to 2 weeks if her right shoulder pain is not improving.  Given prescriptions for methocarbamol  and meloxicam  with precautions discussed regarding taking these medications. Also discussed RICE.  Provided shoulder sling for comfort to wear as needed.  Work note provided with lifting restrictions.  The patient may return to the emergency department for any new, worsening, or concerning symptoms. Patient was given the opportunity to ask questions; all questions were answered. Emergency department return precautions were discussed with the patient.  Patient is in agreement to the treatment plan.  Patient is stable for discharge.    FINAL CLINICAL IMPRESSION(S) / ED DIAGNOSES   Final diagnoses:  Acute pain of right shoulder     Rx / DC Orders   ED Discharge Orders          Ordered    Ambulatory Referral to Primary Care (Establish Care)        12/29/23 1327    meloxicam  (MOBIC ) 15 MG tablet  Daily        12/29/23 1327    methocarbamol  (ROBAXIN ) 500 MG tablet  Every 8 hours PRN        12/29/23 1327             Note:  This document was prepared using Dragon voice recognition software and may include unintentional dictation errors.     Sheron Salm, PA-C 12/29/23 1358    Dorothyann Drivers, MD 12/30/23 2213

## 2023-12-29 NOTE — ED Triage Notes (Signed)
 Pt via POV from work. Pt c/o R shoulder pain. 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 No obvious deformity. Pt is worker comp pt. Pt is A&Ox4 and NAD

## 2023-12-29 NOTE — Discharge Instructions (Addendum)
 We believe that your symptoms are caused by a musculoskeletal strain of your shoulder.  Please read through the included information about additional care such as heating pads, over-the-counter pain medicine. Remember that early mobility and using the affected part of your body is actually better than keeping it immobile. Wear the shoulder sling as needed for comfort. Ice what hurts for 30 minutes at a time.  You were prescribed Meloxicam  (antiinflammatory) and Methocarbamol  (muscle relaxer) to help with your pain.  Please take these medications only as prescribed. Please do not work, make legal-binding decisions, climb on any ladders/heights, drink alcohol or operate a motor vehicle while taking the Methocarbamol .  Please do not take Ibuprofen , Aleve, Advil , Motrin , Naproxen, Aspirin, or any other non-steroidal antiinflammatory drug (NSAID) while taking the Meloxicam . Please stop taking the Meloxicam  if you experience any stomach cramping.  Follow-up with the doctor listed (Dr. Jackquline Barrack) as recommended (if you are not feeling  better in 1-2 weeks; this may take 4-6 weeks to fully heal) or return to the emergency department with new or worsening symptoms that concern you. I have also placed a referral to primary care for you.  Please look through the list of resources and give one of their offices a call to establish care for someone to take care of your overall health.

## 2024-01-03 ENCOUNTER — Encounter: Payer: Self-pay | Admitting: Orthopedic Surgery

## 2024-01-04 ENCOUNTER — Other Ambulatory Visit: Payer: Self-pay | Admitting: Orthopedic Surgery

## 2024-01-04 DIAGNOSIS — M25511 Pain in right shoulder: Secondary | ICD-10-CM

## 2024-01-11 ENCOUNTER — Other Ambulatory Visit

## 2024-01-14 ENCOUNTER — Ambulatory Visit
Admission: RE | Admit: 2024-01-14 | Discharge: 2024-01-14 | Disposition: A | Discharge status: Home / Self Care | Payer: Worker's Compensation | Source: Ambulatory Visit | Attending: Orthopedic Surgery | Admitting: Orthopedic Surgery

## 2024-01-14 DIAGNOSIS — M25511 Pain in right shoulder: Secondary | ICD-10-CM
